# Patient Record
Sex: Female | Born: 1980 | Race: Asian | Hispanic: No | Smoking: Never smoker
Health system: Southern US, Community
[De-identification: ages and names within clinical notes are randomized; demographics above are authoritative.]

## PROBLEM LIST (undated history)

## (undated) ENCOUNTER — Inpatient Hospital Stay (HOSPITAL_COMMUNITY): Payer: Self-pay

## (undated) DIAGNOSIS — Z789 Other specified health status: Secondary | ICD-10-CM

## (undated) HISTORY — PX: NO PAST SURGERIES: SHX2092

---

## 2003-07-05 ENCOUNTER — Inpatient Hospital Stay (HOSPITAL_COMMUNITY): Admission: AD | Admit: 2003-07-05 | Discharge: 2003-07-05 | Payer: Self-pay | Admitting: Family Medicine

## 2003-07-15 ENCOUNTER — Emergency Department (HOSPITAL_COMMUNITY): Admission: EM | Admit: 2003-07-15 | Discharge: 2003-07-15 | Payer: Self-pay | Admitting: Emergency Medicine

## 2003-07-23 ENCOUNTER — Inpatient Hospital Stay (HOSPITAL_COMMUNITY): Admission: AD | Admit: 2003-07-23 | Discharge: 2003-07-24 | Payer: Self-pay | Admitting: Family Medicine

## 2003-08-17 ENCOUNTER — Encounter: Admission: RE | Admit: 2003-08-17 | Discharge: 2003-08-17 | Payer: Self-pay | Admitting: *Deleted

## 2003-08-19 ENCOUNTER — Ambulatory Visit (HOSPITAL_COMMUNITY): Admission: RE | Admit: 2003-08-19 | Discharge: 2003-08-19 | Payer: Self-pay | Admitting: Obstetrics and Gynecology

## 2003-11-07 ENCOUNTER — Inpatient Hospital Stay (HOSPITAL_COMMUNITY): Admission: AD | Admit: 2003-11-07 | Discharge: 2003-11-09 | Payer: Self-pay | Admitting: Obstetrics

## 2006-05-07 ENCOUNTER — Inpatient Hospital Stay (HOSPITAL_COMMUNITY): Admission: AD | Admit: 2006-05-07 | Discharge: 2006-05-07 | Payer: Self-pay | Admitting: Obstetrics and Gynecology

## 2007-03-27 ENCOUNTER — Emergency Department (HOSPITAL_COMMUNITY): Admission: EM | Admit: 2007-03-27 | Discharge: 2007-03-27 | Payer: Self-pay | Admitting: Emergency Medicine

## 2008-10-28 ENCOUNTER — Ambulatory Visit: Payer: Self-pay | Admitting: Obstetrics and Gynecology

## 2008-11-04 ENCOUNTER — Ambulatory Visit: Payer: Self-pay | Admitting: Family Medicine

## 2008-11-04 ENCOUNTER — Other Ambulatory Visit: Admission: RE | Admit: 2008-11-04 | Discharge: 2008-11-04 | Payer: Self-pay | Admitting: Obstetrics and Gynecology

## 2008-11-18 ENCOUNTER — Ambulatory Visit: Payer: Self-pay | Admitting: Obstetrics & Gynecology

## 2009-02-23 ENCOUNTER — Encounter: Payer: Self-pay | Admitting: Obstetrics and Gynecology

## 2009-02-23 ENCOUNTER — Ambulatory Visit: Payer: Self-pay | Admitting: Obstetrics and Gynecology

## 2009-03-31 ENCOUNTER — Ambulatory Visit: Payer: Self-pay | Admitting: Obstetrics and Gynecology

## 2009-03-31 ENCOUNTER — Other Ambulatory Visit: Admission: RE | Admit: 2009-03-31 | Discharge: 2009-03-31 | Payer: Self-pay | Admitting: Obstetrics and Gynecology

## 2009-09-08 ENCOUNTER — Emergency Department (HOSPITAL_COMMUNITY): Admission: EM | Admit: 2009-09-08 | Discharge: 2009-09-08 | Payer: Self-pay | Admitting: Emergency Medicine

## 2009-10-21 ENCOUNTER — Ambulatory Visit: Payer: Self-pay | Admitting: Obstetrics & Gynecology

## 2009-11-03 ENCOUNTER — Ambulatory Visit (HOSPITAL_COMMUNITY): Admission: RE | Admit: 2009-11-03 | Discharge: 2009-11-03 | Payer: Self-pay | Admitting: Family Medicine

## 2009-12-15 ENCOUNTER — Ambulatory Visit (HOSPITAL_COMMUNITY): Admission: RE | Admit: 2009-12-15 | Discharge: 2009-12-15 | Payer: Self-pay | Admitting: Obstetrics & Gynecology

## 2010-01-05 ENCOUNTER — Ambulatory Visit (HOSPITAL_COMMUNITY): Admission: RE | Admit: 2010-01-05 | Discharge: 2010-01-05 | Payer: Self-pay | Admitting: Obstetrics & Gynecology

## 2010-01-27 ENCOUNTER — Ambulatory Visit (HOSPITAL_COMMUNITY): Admission: RE | Admit: 2010-01-27 | Discharge: 2010-01-27 | Payer: Self-pay | Admitting: Obstetrics & Gynecology

## 2010-02-15 ENCOUNTER — Ambulatory Visit (HOSPITAL_COMMUNITY): Admission: RE | Admit: 2010-02-15 | Discharge: 2010-02-15 | Payer: Self-pay | Admitting: Obstetrics & Gynecology

## 2010-05-31 ENCOUNTER — Inpatient Hospital Stay (HOSPITAL_COMMUNITY): Admission: AD | Admit: 2010-05-31 | Discharge: 2010-06-03 | Payer: Self-pay | Admitting: Obstetrics

## 2010-09-03 ENCOUNTER — Emergency Department (HOSPITAL_COMMUNITY)
Admission: EM | Admit: 2010-09-03 | Discharge: 2010-09-03 | Payer: Self-pay | Source: Home / Self Care | Admitting: Emergency Medicine

## 2010-10-22 ENCOUNTER — Encounter: Payer: Self-pay | Admitting: Obstetrics & Gynecology

## 2010-12-14 LAB — CBC
HCT: 24.7 % — ABNORMAL LOW (ref 36.0–46.0)
HCT: 31.5 % — ABNORMAL LOW (ref 36.0–46.0)
Hemoglobin: 7.5 g/dL — ABNORMAL LOW (ref 12.0–15.0)
Hemoglobin: 9.8 g/dL — ABNORMAL LOW (ref 12.0–15.0)
MCH: 24.7 pg — ABNORMAL LOW (ref 26.0–34.0)
MCH: 24.9 pg — ABNORMAL LOW (ref 26.0–34.0)
MCHC: 30.5 g/dL (ref 30.0–36.0)
MCHC: 31.1 g/dL (ref 30.0–36.0)
MCV: 80.1 fL (ref 78.0–100.0)
MCV: 81.2 fL (ref 78.0–100.0)
Platelets: 212 10*3/uL (ref 150–400)
Platelets: 276 10*3/uL (ref 150–400)
RBC: 3.04 MIL/uL — ABNORMAL LOW (ref 3.87–5.11)
RBC: 3.93 MIL/uL (ref 3.87–5.11)
RDW: 15.9 % — ABNORMAL HIGH (ref 11.5–15.5)
RDW: 16 % — ABNORMAL HIGH (ref 11.5–15.5)
WBC: 10.3 10*3/uL (ref 4.0–10.5)
WBC: 13.9 10*3/uL — ABNORMAL HIGH (ref 4.0–10.5)

## 2010-12-14 LAB — RPR: RPR Ser Ql: NONREACTIVE

## 2010-12-17 LAB — POCT PREGNANCY, URINE: Preg Test, Ur: POSITIVE

## 2011-01-07 LAB — POCT PREGNANCY, URINE: Preg Test, Ur: NEGATIVE

## 2011-01-09 LAB — POCT PREGNANCY, URINE: Preg Test, Ur: NEGATIVE

## 2011-01-16 LAB — POCT PREGNANCY, URINE: Preg Test, Ur: NEGATIVE

## 2011-02-13 NOTE — Group Therapy Note (Signed)
NAME:  HILLERY, Erika Little NO.:  [id number]   MEDICAL RECORD NO.:  [id number]          PATIENT TYPE:  WOC   LOCATION:  [facility]                   FACILITY:  [facility]   PHYSICIAN:  Argentina Donovan, MD        DATE OF BIRTH:  16-Nov-1980   DATE OF SERVICE:  10/28/2008                                  CLINIC NOTE   The patient is a 30 year old Asian female gravida 2, para 1-0-1-14 with a  77-year-old child who had a slightly abnormal Pap smear a year and a half  ago.  It was repeated 6 months later, then following 6 months later.  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  She came in today for evaluation for treatment and because of  the probable involvement of the endocervix, I thought that the LEEP  procedure would be the best treatment for her.  In looking at the cervix  it is well epithelialized and I think she is a good risk for an office  LEEP procedure.  I have described the procedure in detail to the patient  with the risks and possible bleeding and difficulty perhaps carrying a  term pregnancy because of weakness in the cervix, an d we talked about  these things.  She seems to understand.  At the and I asked her if she  had any questions.  She had none.  I am having her watch the film on a  LEEP and then we will let her return for the procedure to be done.  I  will ask her one more time before she leaves if she has any further  questions.           ______________________________  Argentina Donovan, MD     PR/MEDQ  D:  10/28/2008  T:  10/28/2008  Job:  [id number]

## 2011-02-13 NOTE — Group Therapy Note (Signed)
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  The patient was in today scheduled for a colposcopy follow-  up, which I feel is somewhat over treatment, so we did a Pap smear  today.  Based on that, if that is abnormal, we will have her come back  for colposcopy.  Otherwise, a repeat Pap smear in 6 months.   IMPRESSION:  Severe cervical dysplasia post LEEP.  Pap smear done.           ______________________________  Argentina Donovan, MD     PR/MEDQ  D:  02/23/2009  T:  02/23/2009  Job:  [id number]

## 2011-02-13 NOTE — Group Therapy Note (Signed)
Erika, Little NO.:  [id number]   MEDICAL RECORD NO.:  [id number]          PATIENT TYPE:  WOC   LOCATION:  [facility]                   FACILITY:  [facility]   PHYSICIAN:  Scheryl Darter, MD       DATE OF BIRTH:  March 05, 1981   DATE OF SERVICE:  11/04/2008                                  CLINIC NOTE   REASON FOR VISIT:  LEEP.   INDICATIONS FOR PROCEDURE:  Ms. Erika Little is a 30 year old gravida 2,  para 1-0-1-1 who had a recent colposcopy revealing focal HGSIL lesion  with dysplasia extending to his endocervical glands.  An endocervical  curettage showed ascus with at least low-grade SIL.  She has previously  been counseled on the risks and benefits of LEEP and has previously  watched video.  Today, I reviewed the risks and benefits of the  procedure to include but not limiting to bleeding, infection, pain and  need for further procedure as well as the possibility of cervical  components, and subsequent pregnancies.  The patient voiced  understanding the risks and was in agreement to proceed with the  procedure.   DESCRIPTION OF PROCEDURE:  An informed consent was obtained and was on  the chart of the patient.  A time-out was conducted.  Speculum was  inserted and under colposcopic evaluation acetowhite epithelium was  placed on the cervix.  The acetowhite area was identified at the 11  o'clock position.  A paracervical block was done using 1% lidocaine with  epinephrine at 10 mL.  A LEEP biopsy was obtained using the Fischer LEEP  biopsy cone without difficulties.  The edges of the biopsy were then  treated with electrocautery and then Monsel solution was placed on the  biopsy area.  Good hemostasis noted.  A speculum was removed and the  patient was given post LEEP instructions.   ESTIMATED BLOOD LOSS:  Minimal.   COMPLICATIONS:  None immediate.   The patient was discharged in good condition with standard instructions  as well as an appointment to follow up  in 2 weeks for the results of the  procedure.     ______________________________  Odie Sera, D.O.    ______________________________  Scheryl Darter, MD    MC/MEDQ  D:  11/04/2008  T:  11/05/2008  Job:  [id number]

## 2011-12-01 IMAGING — US US OB COMP LESS 14 WK
1 series · 14 of 15 positions shown · non-contrast
Comparison: none

OBSTETRICAL ULTRASOUND:
 This ultrasound exam was performed in the [HOSPITAL] Ultrasound Department.  The OB US report was generated in the AS system, and faxed to the ordering physician.  This report is also available in [HOSPITAL]?s Access[employer] and in [REDACTED] PACS.

[Series 1: us ob comp less 14 wks · 14 of 15 slices shown]
[im 1/15]
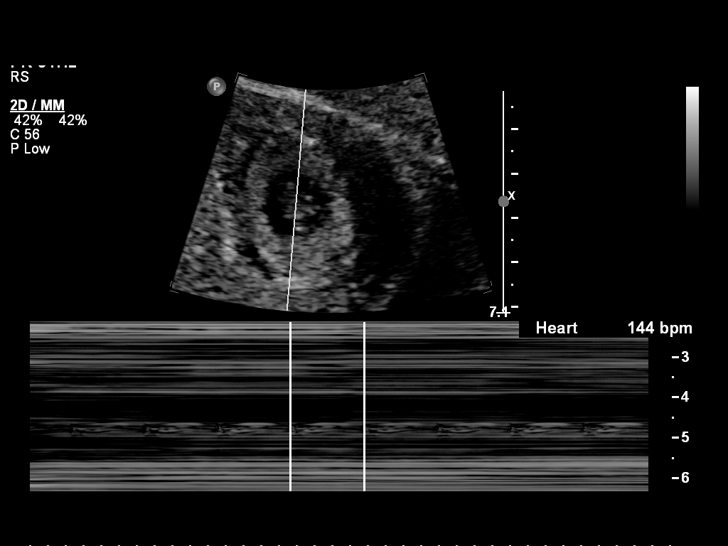
[im 2/15]
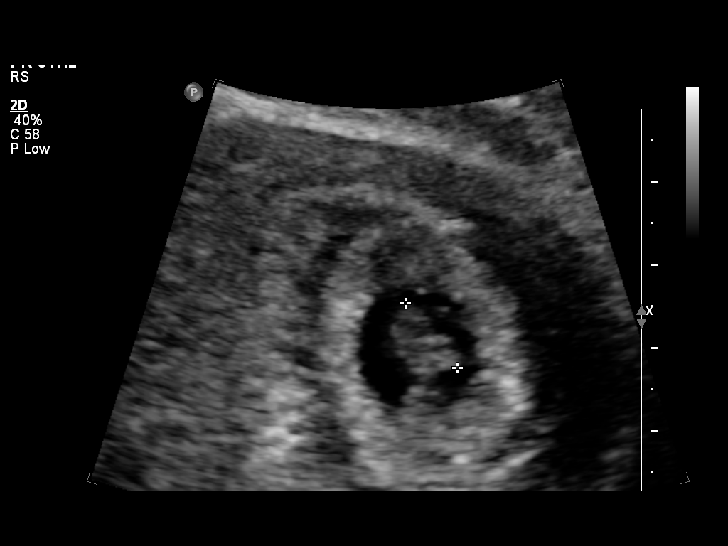
[im 3/15]
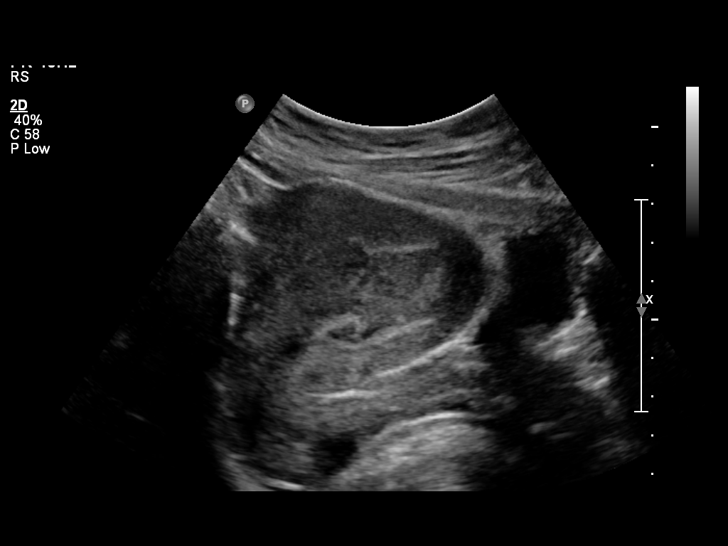
[im 4/15]
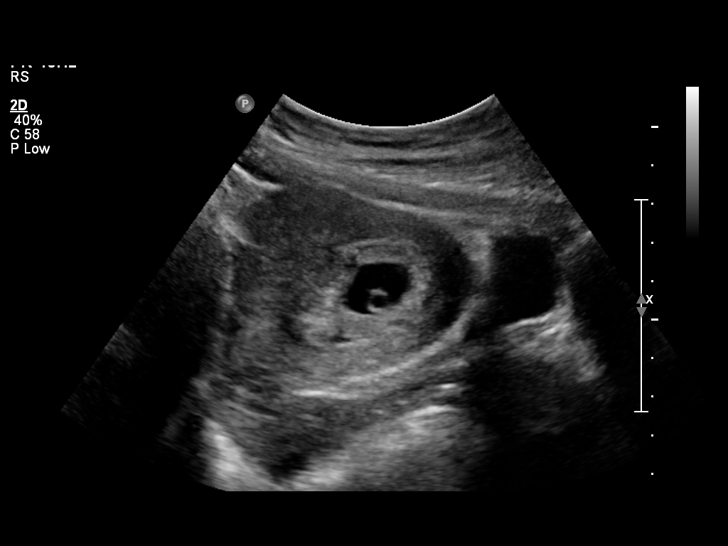
[im 5/15]
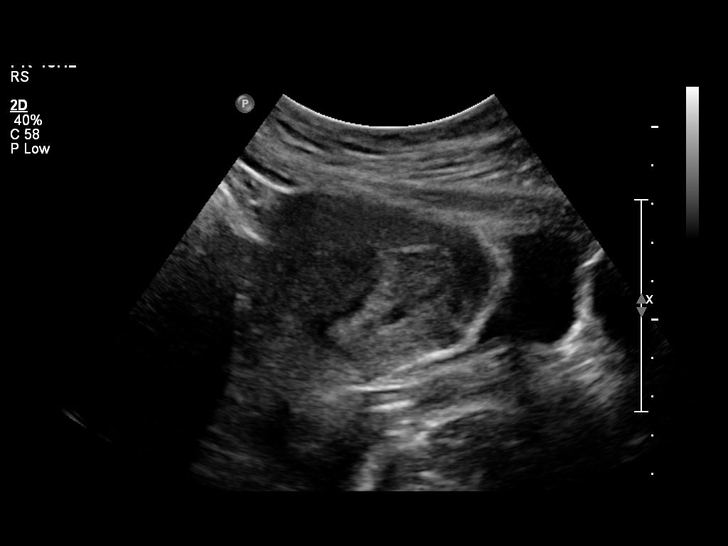
[im 6/15]
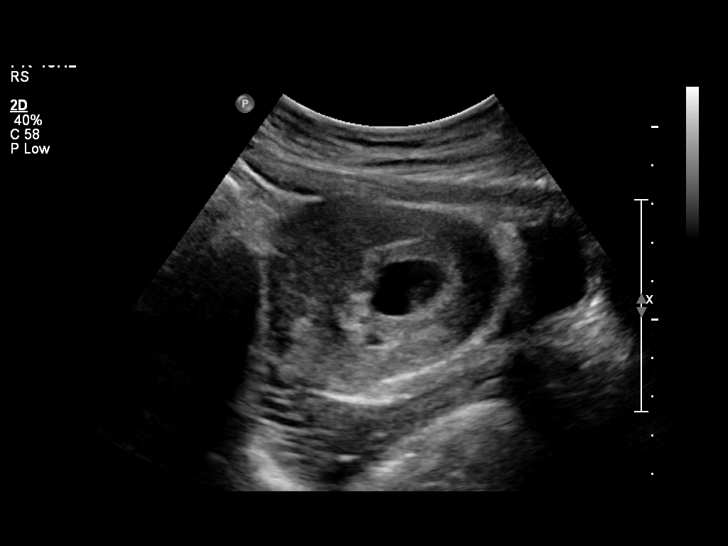
[im 7/15]
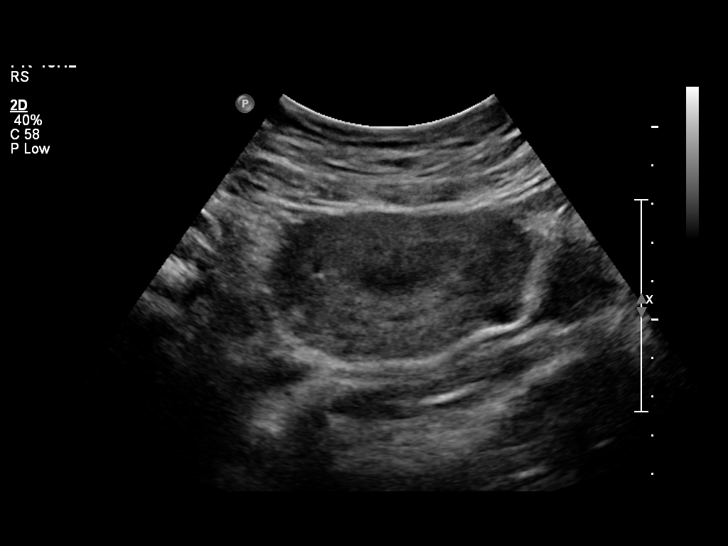
[im 9/15]
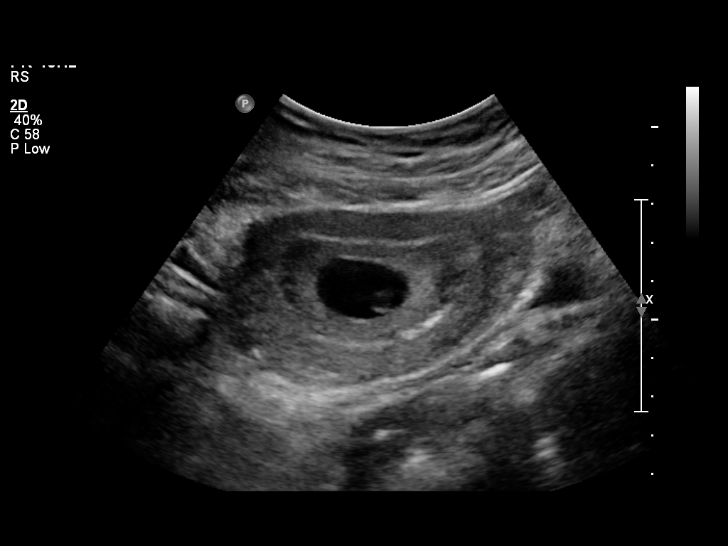
[im 10/15]
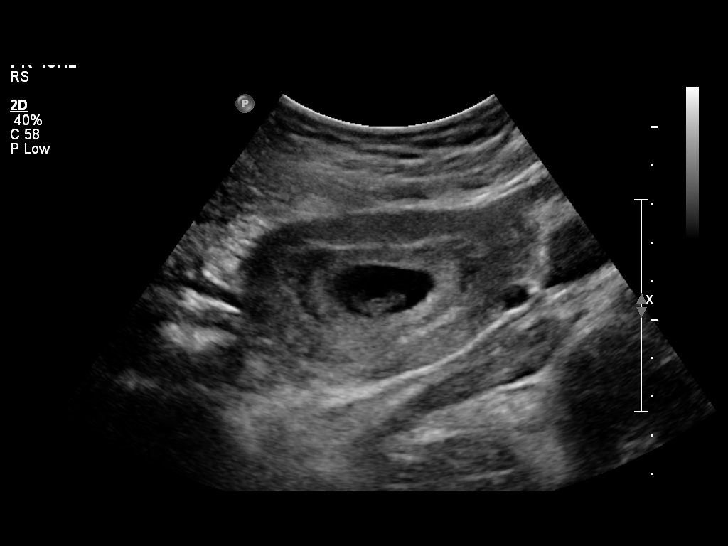
[im 11/15]
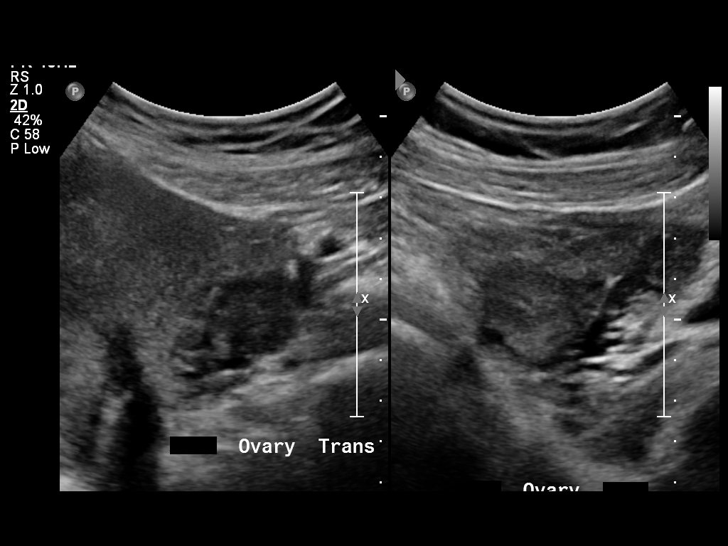
[im 12/15]
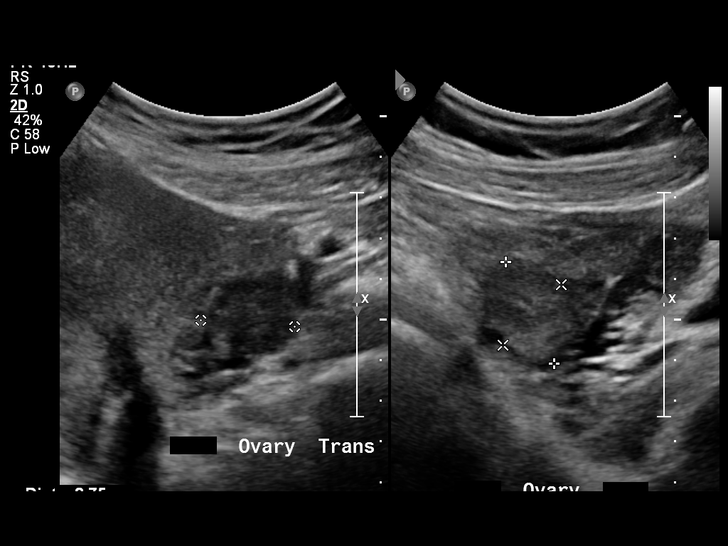
[im 13/15]
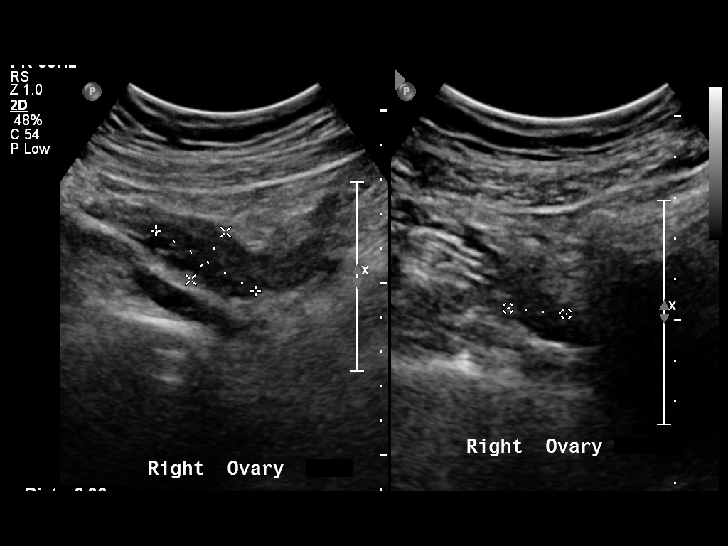
[im 14/15]
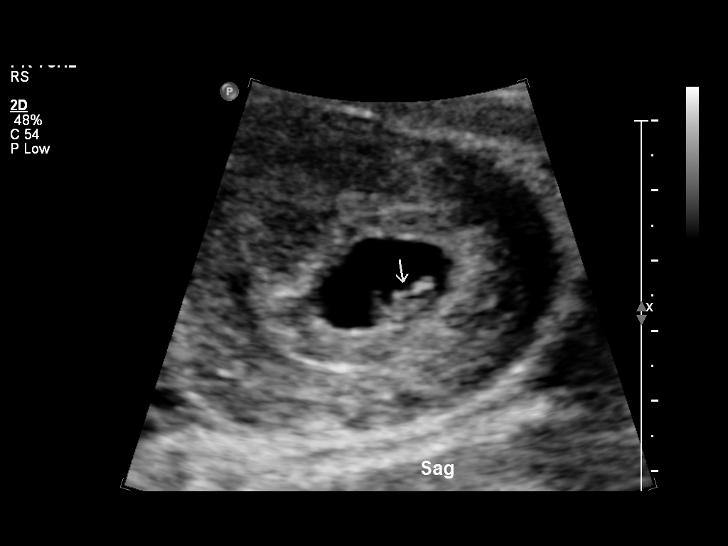
[im 15/15]
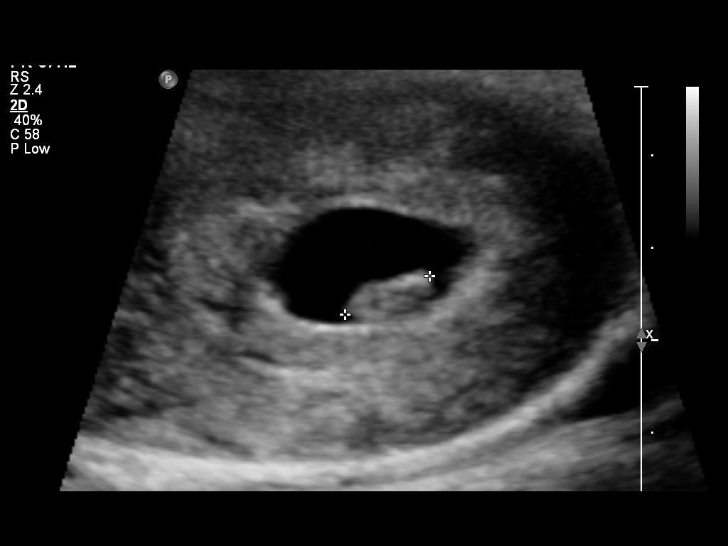

[14 of 15 positions shown; findings below may reference images not displayed]

IMPRESSION: See [facility] Obstetric US report.

## 2012-02-29 ENCOUNTER — Emergency Department (HOSPITAL_COMMUNITY)
Admission: EM | Admit: 2012-02-29 | Discharge: 2012-02-29 | Disposition: A | Discharge status: Home / Self Care | Payer: Self-pay | Attending: Emergency Medicine | Admitting: Emergency Medicine

## 2012-02-29 ENCOUNTER — Emergency Department (HOSPITAL_COMMUNITY): Payer: Self-pay

## 2012-02-29 ENCOUNTER — Encounter (HOSPITAL_COMMUNITY): Payer: Self-pay

## 2012-02-29 DIAGNOSIS — H538 Other visual disturbances: Secondary | ICD-10-CM | POA: Insufficient documentation

## 2012-02-29 DIAGNOSIS — G44209 Tension-type headache, unspecified, not intractable: Secondary | ICD-10-CM | POA: Insufficient documentation

## 2012-02-29 LAB — URINALYSIS, ROUTINE W REFLEX MICROSCOPIC
Glucose, UA: NEGATIVE mg/dL
Hgb urine dipstick: NEGATIVE
Ketones, ur: NEGATIVE mg/dL
Nitrite: NEGATIVE
Protein, ur: NEGATIVE mg/dL
Specific Gravity, Urine: 1.026 (ref 1.005–1.030)
Urobilinogen, UA: 1 mg/dL (ref 0.0–1.0)
pH: 6.5 (ref 5.0–8.0)

## 2012-02-29 LAB — URINE MICROSCOPIC-ADD ON

## 2012-02-29 MED ORDER — METOCLOPRAMIDE HCL 10 MG PO TABS: 10.0000 mg | ORAL_TABLET | Freq: Four times a day (QID) | ORAL | Status: DC | PRN

## 2012-02-29 MED ORDER — DIPHENHYDRAMINE HCL 50 MG/ML IJ SOLN
25.0000 mg | Freq: Once | INTRAMUSCULAR | Status: AC
  Administered 2012-02-29: 25 mg via INTRAVENOUS
  Filled 2012-02-29: qty 1

## 2012-02-29 MED ORDER — METOCLOPRAMIDE HCL 5 MG/ML IJ SOLN
10.0000 mg | Freq: Once | INTRAMUSCULAR | Status: AC
  Administered 2012-02-29: 10 mg via INTRAVENOUS
  Filled 2012-02-29: qty 2

## 2012-02-29 MED ORDER — SODIUM CHLORIDE 0.9 % IV BOLUS (SEPSIS)
1000.0000 mL | Freq: Once | INTRAVENOUS | Status: AC
  Administered 2012-02-29: 1000 mL via INTRAVENOUS

## 2012-02-29 NOTE — Discharge Instructions (Signed)
Your headache has some components of tension headache and some components of migraine headache, so I'm giving you information about both. Tried taking the metoclopramide tablets as soon as possible at the start of headache.  Tension Headache (Muscle Contraction Headache) Tension headache is one of the most common causes of head pain. These headaches are usually felt as a pain over the top of your head and back of your neck. Stress, anxiety, and depression are common triggers for these headaches. Tension headaches are not life-threatening and will not lead to other types of headaches. Tension headaches can often be diagnosed by taking a history from the patient and a physical exam. Sometimes, further lab and x-ray studies are used to confirm the diagnosis. Your caregiver can advise you on how to get help solving problems that cause anxiety or stress. Antidepressants can be prescribed if depression is a problem. HOME CARE INSTRUCTIONS   If testing was done, call for your results. Remember, it is your responsibility to get the results of all testing. Do not assume everything is fine because you do not hear from your caregiver.   Only take over-the-counter or prescription medicines for pain, discomfort, or fever as directed by your caregiver.   Biofeedback, massage, or other relaxation techniques may be helpful.   Ice packs or heat to the head and neck can be used. Use these three to four times per day or as needed.   Physical therapy may be a useful addition to treatment.   If headaches continue, even with therapy, you may need to think about lifestyle changes.   Avoid excessive use of pain killers, as rebound headaches can occur.  SEEK MEDICAL CARE IF:   You develop problems with medications prescribed.   You do not respond or get no relief from medications.   You have a change from the usual headache.   You develop nausea (feeling sick to your stomach) or vomiting.  SEEK IMMEDIATE MEDICAL  CARE IF:   Your headache becomes severe.   You have an unexplained oral temperature above 102 F (38.9 C).   You develop a stiff neck.   You have loss of vision.   You have muscular weakness.   You have loss of muscular control.   You develop severe symptoms different from your first symptoms.   You start losing your balance or have trouble walking.   You feel faint or pass out.  MAKE SURE YOU:   Understand these instructions.   Will watch your condition.   Will get help right away if you are not doing well or get worse.  Document Released: 09/17/2005 Document Revised: 09/06/2011 Document Reviewed: 05/06/2008 [facility] Patient Information 2012 [place], [place].  Migraine Headache A migraine headache is an intense, throbbing pain on one or both sides of your head. The exact cause of a migraine headache is not always known. A migraine may be caused when nerves in the brain become irritated and release chemicals that cause swelling within blood vessels, causing pain. Many migraine sufferers have a family history of migraines. Before you get a migraine you may or may not get an aura. An aura is a group of symptoms that can predict the beginning of a migraine. An aura may include:  Visual changes such as:   Flashing lights.   Bright spots or zig-zag lines.   Tunnel vision.   Feelings of numbness.   Trouble talking.   Muscle weakness.  SYMPTOMS  Pain on one or both sides of your  head.   Pain that is pulsating or throbbing in nature.   Pain that is severe enough to prevent daily activities.   Pain that is aggravated by any daily physical activity.   Nausea (feeling sick to your stomach), vomiting, or both.   Pain with exposure to bright lights, loud noises, or activity.   General sensitivity to bright lights or loud noises.  MIGRAINE TRIGGERS Examples of triggers of migraine headaches include:   Alcohol.   Smoking.   Stress.   It may be related to menses  (female menstruation).   Aged cheeses.   Foods or drinks that contain nitrates, glutamate, aspartame, or tyramine.   Lack of sleep.   Chocolate.   Caffeine.   Hunger.   Medications such as nitroglycerine (used to treat chest pain), birth control pills, estrogen, and some blood pressure medications.  DIAGNOSIS  A migraine headache is often diagnosed based on:  Symptoms.   Physical examination.   A computerized X-ray scan (computed tomography, CT) of your head.  TREATMENT  Medications can help prevent migraines if they are recurrent or should they become recurrent. Your caregiver can help you with a medication or treatment program that will be helpful to you.   Lying down in a dark, quiet room may be helpful.   Keeping a headache diary may help you find a trend as to what may be triggering your headaches.  SEEK IMMEDIATE MEDICAL CARE IF:   You have confusion, personality changes or seizures.   You have headaches that wake you from sleep.   You have an increased frequency in your headaches.   You have a stiff neck.   You have a loss of vision.   You have muscle weakness.   You start losing your balance or have trouble walking.   You feel faint or pass out.  MAKE SURE YOU:   Understand these instructions.   Will watch your condition.   Will get help right away if you are not doing well or get worse.  Document Released: 09/17/2005 Document Revised: 09/06/2011 Document Reviewed: 05/03/2009 [facility] Patient Information 2012 [place], [place].  Metoclopramide tablets What is this medicine? METOCLOPRAMIDE (met oh kloe PRA mide) is used to treat the symptoms of gastroesophageal reflux disease (GERD) like heartburn. It is also used to treat people with slow emptying of the stomach and intestinal tract. This medicine may be used for other purposes; ask your health care provider or pharmacist if you have questions. What should I tell my health care provider before I take  this medicine? They need to know if you have any of these conditions: -breast cancer -depression -diabetes -heart failure -high blood pressure -kidney disease -liver disease -Parkinson's disease or a movement disorder -pheochromocytoma -seizures -stomach obstruction, bleeding, or perforation -an unusual or allergic reaction to metoclopramide, procainamide, sulfites, other medicines, foods, dyes, or preservatives -pregnant or trying to get pregnant -breast-feeding How should I use this medicine? Take this medicine by mouth with a glass of water. Follow the directions on the prescription label. Take this medicine on an empty stomach, about 30 minutes before eating. Take your doses at regular intervals. Do not take your medicine more often than directed. Do not stop taking except on the advice of your doctor or health care professional. A special MedGuide will be given to you by the pharmacist with each prescription and refill. Be sure to read this information carefully each time. Talk to your pediatrician regarding the use of this medicine in children. Special  care may be needed. Overdosage: If you think you have taken too much of this medicine contact a poison control center or emergency room at once. NOTE: This medicine is only for you. Do not share this medicine with others. What if I miss a dose? If you miss a dose, take it as soon as you can. If it is almost time for your next dose, take only that dose. Do not take double or extra doses. What may interact with this medicine? -acetaminophen -cyclosporine -digoxin -medicines for blood pressure -medicines for diabetes, including insulin -medicines for hay fever and other allergies -medicines for depression, especially an Monoamine Oxidase Inhibitor (MAOI) -medicines for Parkinson's disease, like levodopa -medicines for sleep or for pain -tetracycline This list may not describe all possible interactions. Give your health care  provider a list of all the medicines, herbs, non-prescription drugs, or dietary supplements you use. Also tell them if you smoke, drink alcohol, or use illegal drugs. Some items may interact with your medicine. What should I watch for while using this medicine? It may take a few weeks for your stomach condition to start to get better. However, do not take this medicine for longer than 12 weeks. The longer you take this medicine, and the more you take it, the greater your chances are of developing serious side effects. If you are an elderly patient, a female patient, or you have diabetes, you may be at an increased risk for side effects from this medicine. Contact your doctor immediately if you start having movements you cannot control such as lip smacking, rapid movements of the tongue, involuntary or uncontrollable movements of the eyes, head, arms and legs, or muscle twitches and spasms. Patients and their families should watch out for worsening depression or thoughts of suicide. Also watch out for any sudden or severe changes in feelings such as feeling anxious, agitated, panicky, irritable, hostile, aggressive, impulsive, severely restless, overly excited and hyperactive, or not being able to sleep. If this happens, especially at the beginning of treatment or after a change in dose, call your doctor. Do not treat yourself for high fever. Ask your doctor or health care professional for advice. You may get drowsy or dizzy. Do not drive, use machinery, or do anything that needs mental alertness until you know how this drug affects you. Do not stand or sit up quickly, especially if you are an older patient. This reduces the risk of dizzy or fainting spells. Alcohol can make you more drowsy and dizzy. Avoid alcoholic drinks. What side effects may I notice from receiving this medicine? Side effects that you should report to your doctor or health care professional as soon as possible: -allergic reactions like  skin rash, itching or hives, swelling of the face, lips, or tongue -abnormal production of milk in females -breast enlargement in both males and females -change in the way you walk -difficulty moving, speaking or swallowing -drooling, lip smacking, or rapid movements of the tongue -excessive sweating -fever -involuntary or uncontrollable movements of the eyes, head, arms and legs -irregular heartbeat or palpitations -muscle twitches and spasms -unusually weak or tired Side effects that usually do not require medical attention (report to your doctor or health care professional if they continue or are bothersome): -change in sex drive or performance -depressed mood -diarrhea -difficulty sleeping -headache -menstrual changes -restless or nervous This list may not describe all possible side effects. Call your doctor for medical advice about side effects. You may report side effects to FDA  at 1-800-FDA-1088. Where should I keep my medicine? Keep out of the reach of children. Store at room temperature between 20 and 25 degrees C (68 and 77 degrees F). Protect from light. Keep container tightly closed. Throw away any unused medicine after the expiration date. NOTE: This sheet is a summary. It may not cover all possible information. If you have questions about this medicine, talk to your doctor, pharmacist, or health care provider.  2012, [employer]. (05/12/2008 4:30:05 PM)

## 2012-02-29 NOTE — ED Notes (Signed)
Pt presents with "all over" headache x 1.5 years (since childbirth).  Pt reports getting headaches every week.  +nausea and vomiting, intermittent blurred vision.

## 2012-02-29 NOTE — ED Provider Notes (Signed)
History     CSN: [id number]  Arrival date & time 02/29/12  1126   First MD Initiated Contact with Patient 02/29/12 1140      Chief Complaint  Patient presents with  . Headache    (Consider location/radiation/quality/duration/timing/severity/associated sxs/prior treatment) Patient is a 31 y.o. female presenting with headaches. The history is provided by the patient.  Headache   She has been having intermittent headaches for about the last 1.5 years. Headaches come about once a week and will last all day and part of another day. They're sometimes global but usually right sided and described as dull and throbbing. Pain is 10 over 10 at its worst. Current headache started yesterday and is improved today and is only 5/10. There is sometimes associated blurring of vision and nausea and vomiting. She denies fever, chills, sweats. Headache is worse with exposure to light. The sometimes improve temporarily by massaging although it will return shortly after the massage ascended. She's tried taking Advil with no relief. Of note, headache started about 6 months after the birth of her last baby. Headaches are coming slightly more frequently now but the intensity has not changed.  History reviewed. No pertinent past medical history.  History reviewed. No pertinent past surgical history.  No family history on file.  History  Substance Use Topics  . Smoking status: Never Smoker   . Smokeless tobacco: Not on file  . Alcohol Use: No    OB History    Grav Para Term Preterm Abortions TAB SAB Ect Mult Living                  Review of Systems  Neurological: Positive for headaches.  All other systems reviewed and are negative.    Allergies  Review of patient's allergies indicates no known allergies.  Home Medications   Current Outpatient Rx  Name Route Sig Dispense Refill  . IBUPROFEN 200 MG PO TABS Oral Take 400 mg by mouth every 6 (six) hours as needed. For pain      BP 128/91   Pulse 92  Temp(Src) 98.5 F (36.9 C) (Oral)  Resp 18  Ht 5' (1.524 m)  Wt 125 lb (56.7 kg)  BMI 24.41 kg/m2  SpO2 98%  LMP 01/25/2012  Physical Exam  Nursing note and vitals reviewed.  31 year old female who is resting comfortably in no acute distress. Vital signs are significant for borderline diastolic hypertension with blood pressure 128/91. Oxygen saturation is 98% which is normal. Head is normocephalic and atraumatic. PERRLA, EOMI. Fundi show no hemorrhage, exudate, or papilledema. There is no facial asymmetry. Oropharynx is clear. There is moderate tenderness palpation over the temporalis muscles bilaterally and also over the insertion of the paracervical muscles bilaterally. Neck is nontender except for the above-noted tenderness at insertion of the paracervical muscles. Neck is supple without adenopathy. Back is nontender. Lungs are clear without rales, wheezes, rhonchi. Heart has regular rate and rhythm without murmur. Abdomen is soft, flat, nontender without masses or hepatosplenomegaly. Extremities have full range of motion, no cyanosis or edema. Skin is warm and dry without rash. Neurologic: Mental status is normal, cranial nerves are intact, there are no focal motor or sensory deficits. Coordination is normal. Gait is normal.  ED Course  Procedures (including critical care time)  Results for orders placed during the hospital encounter of 02/29/12  URINALYSIS, ROUTINE W REFLEX MICROSCOPIC      Component Value Range   Color, Urine AMBER (*) YELLOW    APPearance CLOUDY (*)  CLEAR    Specific Gravity, Urine 1.026  1.005 - 1.030    pH 6.5  5.0 - 8.0    Glucose, UA NEGATIVE  NEGATIVE (mg/dL)   Hgb urine dipstick NEGATIVE  NEGATIVE    Bilirubin Urine SMALL (*) NEGATIVE    Ketones, ur NEGATIVE  NEGATIVE (mg/dL)   Protein, ur NEGATIVE  NEGATIVE (mg/dL)   Urobilinogen, UA 1.0  0.0 - 1.0 (mg/dL)   Nitrite NEGATIVE  NEGATIVE    Leukocytes, UA LARGE (*) NEGATIVE   POCT PREGNANCY, URINE       Component Value Range   Preg Test, Ur NEGATIVE  NEGATIVE   URINE MICROSCOPIC-ADD ON      Component Value Range   Squamous Epithelial / LPF MANY (*) RARE    WBC, UA 11-20  <3 (WBC/hpf)   RBC / HPF 0-2  <3 (RBC/hpf)   Bacteria, UA FEW (*) RARE    Ct Head Wo Contrast  02/29/2012  *RADIOLOGY REPORT*  Clinical Data:  Headache  CT HEAD WITHOUT CONTRAST  Technique:  Contiguous axial images were obtained from the base of the skull through the vertex without contrast  Comparison:  None.  Findings:  The brain has a normal appearance without evidence for hemorrhage, acute infarction, hydrocephalus, or mass lesion.  There is no extra axial fluid collection.  The skull and paranasal sinuses are normal.  IMPRESSION: Normal CT of the head without contrast.  Original Report Authenticated By: Camelia Phenes, M.D.      1. Muscle contraction headache       MDM  Headache which is probably a muscle contraction headache but possibly a migraine variant. With onset after pregnancy, consider possibility of Sheehan syndrome. CT will be obtained and she will be treated with a headache cocktail.   She got excellent relief of headache after IV fluid bolus and IV metoclopramide and IV diphenhydramine. Head CT is negative. She'll be sent home with a prescription for metoclopramide. Urinalysis has pyuria but it appears to be a contaminated specimen and therefore she will not be treated for urinary tract infection.     Dione Booze, MD 02/29/12 1341

## 2012-02-29 NOTE — ED Provider Notes (Deleted)
   Dione Booze, MD 02/29/12 1344

## 2014-10-01 NOTE — L&D Delivery Note (Signed)
Pt was admitted in labor. She had AROM with clear fluid. She completed the first stage with out difficulty. She pushed for 30 min and had a SVD on one live viable infant over an intact perineum. Placenta-S/I. EBL-400cc. Baby to NBN.

## 2014-12-17 ENCOUNTER — Inpatient Hospital Stay (HOSPITAL_COMMUNITY)
Admission: AD | Admit: 2014-12-17 | Discharge: 2014-12-17 | Disposition: A | Discharge status: Home / Self Care | Payer: Self-pay | Source: Ambulatory Visit | Attending: Obstetrics and Gynecology | Admitting: Obstetrics and Gynecology

## 2014-12-17 ENCOUNTER — Emergency Department (HOSPITAL_COMMUNITY)
Admission: EM | Admit: 2014-12-17 | Discharge: 2014-12-17 | Disposition: A | Discharge status: Home / Self Care | Payer: Self-pay

## 2014-12-17 ENCOUNTER — Encounter (HOSPITAL_COMMUNITY): Payer: Self-pay | Admitting: *Deleted

## 2014-12-17 ENCOUNTER — Inpatient Hospital Stay (HOSPITAL_COMMUNITY): Payer: Self-pay

## 2014-12-17 DIAGNOSIS — O208 Other hemorrhage in early pregnancy: Secondary | ICD-10-CM | POA: Insufficient documentation

## 2014-12-17 DIAGNOSIS — O418X10 Other specified disorders of amniotic fluid and membranes, first trimester, not applicable or unspecified: Secondary | ICD-10-CM

## 2014-12-17 DIAGNOSIS — O26899 Other specified pregnancy related conditions, unspecified trimester: Secondary | ICD-10-CM

## 2014-12-17 DIAGNOSIS — O468X1 Other antepartum hemorrhage, first trimester: Secondary | ICD-10-CM

## 2014-12-17 DIAGNOSIS — R109 Unspecified abdominal pain: Secondary | ICD-10-CM | POA: Insufficient documentation

## 2014-12-17 DIAGNOSIS — Z3A08 8 weeks gestation of pregnancy: Secondary | ICD-10-CM | POA: Insufficient documentation

## 2014-12-17 HISTORY — DX: Other specified health status: Z78.9

## 2014-12-17 LAB — CBC
HCT: 29.6 % — ABNORMAL LOW (ref 36.0–46.0)
HEMOGLOBIN: 9.1 g/dL — AB (ref 12.0–15.0)
MCH: 23 pg — ABNORMAL LOW (ref 26.0–34.0)
MCHC: 30.7 g/dL (ref 30.0–36.0)
MCV: 74.9 fL — AB (ref 78.0–100.0)
Platelets: 237 10*3/uL (ref 150–400)
RBC: 3.95 MIL/uL (ref 3.87–5.11)
RDW: 16.4 % — ABNORMAL HIGH (ref 11.5–15.5)
WBC: 8.3 10*3/uL (ref 4.0–10.5)

## 2014-12-17 LAB — WET PREP, GENITAL
Clue Cells Wet Prep HPF POC: NONE SEEN
Trich, Wet Prep: NONE SEEN
Yeast Wet Prep HPF POC: NONE SEEN

## 2014-12-17 LAB — URINALYSIS, ROUTINE W REFLEX MICROSCOPIC
Bilirubin Urine: NEGATIVE
Glucose, UA: NEGATIVE mg/dL
KETONES UR: NEGATIVE mg/dL
LEUKOCYTES UA: NEGATIVE
NITRITE: NEGATIVE
PH: 6 (ref 5.0–8.0)
PROTEIN: NEGATIVE mg/dL
SPECIFIC GRAVITY, URINE: 1.025 (ref 1.005–1.030)
UROBILINOGEN UA: 0.2 mg/dL (ref 0.0–1.0)

## 2014-12-17 LAB — HCG, QUANTITATIVE, PREGNANCY: hCG, Beta Chain, Quant, S: 47675 m[IU]/mL — ABNORMAL HIGH (ref <5)

## 2014-12-17 LAB — POCT PREGNANCY, URINE: Preg Test, Ur: POSITIVE — AB

## 2014-12-17 LAB — ABO/RH: ABO/RH(D): B POS

## 2014-12-17 LAB — URINE MICROSCOPIC-ADD ON

## 2014-12-17 NOTE — MAU Provider Note (Signed)
Chief Complaint: Vaginal Bleeding and Possible Pregnancy   First Provider Initiated Contact with Patient 12/17/14 1555      SUBJECTIVE HPI: Erika Little is a 34 y.o. G3P2004 at [redacted]w[redacted]d by LMP who presents to maternity admissions reporting light red/pink bleeding, enough to require a small pad, with onset last night and continuing today. The bleeding is accompanied by mild cramping.  She reports some spotting after intercourse 2 weeks ago which resolved and denies intercourse but reports some sexual activity with her partner last night before this bleeding.  She denies vaginal itching/burning, urinary symptoms, h/a, dizziness, n/v, or fever/chills.    Past Medical History  Diagnosis Date  . Medical history non-contributory    Past Surgical History  Procedure Laterality Date  . No past surgeries     History   Social History  . Marital Status: Legally Separated    Spouse Name: N/A  . Number of Children: N/A  . Years of Education: N/A   Occupational History  . Not on file.   Social History Main Topics  . Smoking status: Never Smoker   . Smokeless tobacco: Not on file  . Alcohol Use: No  . Drug Use: No  . Sexual Activity: Yes   Other Topics Concern  . Not on file   Social History Narrative   No current facility-administered medications on file prior to encounter.   No current outpatient prescriptions on file prior to encounter.   No Known Allergies  ROS: Pertinent items in HPI  OBJECTIVE Blood pressure 118/72, pulse 88, temperature 97.7 F (36.5 C), temperature source Oral, resp. rate 18, height  (1.473 m), weight 55.792 kg (123 lb), last menstrual period 10/20/2014. GENERAL: Well-developed, well-nourished female in no acute distress.  HEENT: Normocephalic HEART: normal rate RESP: normal effort ABDOMEN: Soft, non-tender EXTREMITIES: Nontender, no edema NEURO: Alert and oriented Pelvic exam: Cervix pink, visually closed, without lesion, small amount thin watery  bleeding, vaginal walls and external genitalia normal Bimanual exam: Cervix 0/long/high, firm, anterior, neg CMT, uterus nontender, ~ 8 week size, adnexa without tenderness, enlargement, or mass  LAB RESULTS Results for orders placed or performed during the hospital encounter of 12/17/14 (from the past 24 hour(s))  Pregnancy, urine POC     Status: Abnormal   Collection Time: 12/17/14  3:13 PM  Result Value Ref Range   Preg Test, Ur POSITIVE (A) NEGATIVE  Urinalysis, Routine w reflex microscopic     Status: Abnormal   Collection Time: 12/17/14  3:15 PM  Result Value Ref Range   Color, Urine YELLOW YELLOW   APPearance CLEAR CLEAR   Specific Gravity, Urine 1.025 1.005 - 1.030   pH 6.0 5.0 - 8.0   Glucose, UA NEGATIVE NEGATIVE mg/dL   Hgb urine dipstick LARGE (A) NEGATIVE   Bilirubin Urine NEGATIVE NEGATIVE   Ketones, ur NEGATIVE NEGATIVE mg/dL   Protein, ur NEGATIVE NEGATIVE mg/dL   Urobilinogen, UA 0.2 0.0 - 1.0 mg/dL   Nitrite NEGATIVE NEGATIVE   Leukocytes, UA NEGATIVE NEGATIVE  Wet prep, genital     Status: Abnormal   Collection Time: 12/17/14  3:15 PM  Result Value Ref Range   Yeast Wet Prep HPF POC NONE SEEN NONE SEEN   Trich, Wet Prep NONE SEEN NONE SEEN   Clue Cells Wet Prep HPF POC NONE SEEN NONE SEEN   WBC, Wet Prep HPF POC FEW (A) NONE SEEN  Urine microscopic-add on     Status: None   Collection Time: 12/17/14  3:15 PM  Result Value Ref Range   Squamous Epithelial / LPF RARE RARE   Bacteria, UA RARE RARE   Urine-Other MUCOUS PRESENT   CBC     Status: Abnormal   Collection Time: 12/17/14  3:29 PM  Result Value Ref Range   WBC 8.3 4.0 - 10.5 K/uL   RBC 3.95 3.87 - 5.11 MIL/uL   Hemoglobin 9.1 (L) 12.0 - 15.0 g/dL   HCT 11.9 (L) 14.7 - 82.9 %   MCV 74.9 (L) 78.0 - 100.0 fL   MCH 23.0 (L) 26.0 - 34.0 pg   MCHC 30.7 30.0 - 36.0 g/dL   RDW 56.2 (H) 13.0 - 86.5 %   Platelets 237 150 - 400 K/uL  hCG, quantitative, pregnancy     Status: Abnormal   Collection Time:  12/17/14  3:29 PM  Result Value Ref Range   hCG, Beta Chain, Quant, S 47675 (H) <5 mIU/mL  ABO/Rh     Status: None (Preliminary result)   Collection Time: 12/17/14  3:29 PM  Result Value Ref Range   ABO/RH(D) B POS     IMAGING US Ob Comp Less 14 Wks  12/17/2014   CLINICAL DATA:  Vaginal bleeding, estimated gestational age by last menstrual period equals 8 weeks 2 days.  EXAM: OBSTETRIC <14 WK [place] AND TRANSVAGINAL OB US  TECHNIQUE: Both transabdominal and transvaginal ultrasound examinations were performed for complete evaluation of the gestation as well as the maternal uterus, adnexal regions, and pelvic cul-de-sac. Transvaginal technique was performed to assess early pregnancy.  COMPARISON:  None.  FINDINGS: Intrauterine gestational sac: Single  Yolk sac:  Present  Embryo:  Present  Cardiac Activity: Present  Heart Rate: 137  bpm  CRL:  9.3  mm   7 w   0 d                  [place] EDC: 08/05/2015  Maternal uterus/adnexae: Moderate subchorionic hemorrhage noted. Corpus luteal cyst of the right ovary. No free fluid.  IMPRESSION: 1. Single intrauterine gestation with embryo and normal cardiac activity.  2. Estimated gestational age by crown rump length equals 7 weeks 0 days.  3. Moderate subchorionic hemorrhage   Electronically Signed   By: Genevive Bi M.D.   On: 12/17/2014 17:20   US Ob Transvaginal  12/17/2014   CLINICAL DATA:  Vaginal bleeding, estimated gestational age by last menstrual period equals 8 weeks 2 days.  EXAM: OBSTETRIC <14 WK [place] AND TRANSVAGINAL OB US  TECHNIQUE: Both transabdominal and transvaginal ultrasound examinations were performed for complete evaluation of the gestation as well as the maternal uterus, adnexal regions, and pelvic cul-de-sac. Transvaginal technique was performed to assess early pregnancy.  COMPARISON:  None.  FINDINGS: Intrauterine gestational sac: Single  Yolk sac:  Present  Embryo:  Present  Cardiac Activity: Present  Heart Rate: 137  bpm  CRL:  9.3  mm   7 w   0  d                  [place] EDC: 08/05/2015  Maternal uterus/adnexae: Moderate subchorionic hemorrhage noted. Corpus luteal cyst of the right ovary. No free fluid.  IMPRESSION: 1. Single intrauterine gestation with embryo and normal cardiac activity.  2. Estimated gestational age by crown rump length equals 7 weeks 0 days.  3. Moderate subchorionic hemorrhage   Electronically Signed   By: Genevive Bi M.D.   On: 12/17/2014 17:20    ASSESSMENT 1. Subchorionic hemorrhage in first trimester  2. Abdominal pain affecting pregnancy     PLAN Discharge home with bleeding precautions [facility], questions answered Pelvic rest F/U with early prenatal care, pt planning to see Dr Gaynell FaceMarshall Return to MAU as needed for emergencies    Medication List    STOP taking these medications        aspirin-acetaminophen-caffeine 250-250-65 MG per tablet  Commonly known as:  EXCEDRIN MIGRAINE     ibuprofen 200 MG tablet  Commonly known as:  ADVIL,MOTRIN     metoCLOPramide 10 MG tablet  Commonly known as:  REGLAN      TAKE these medications        prenatal multivitamin Tabs tablet  Take 1 tablet by mouth daily at 12 noon.     VITAMIN D PO  Take 1 tablet by mouth 2 (two) times a week.       Follow-up Information    Please follow up.   Why:  With prenatal provider of your choice      Sharen CounterLisa Leftwich-Kirby Certified Nurse-Midwife 12/17/2014  7:49 PM

## 2014-12-17 NOTE — Discharge Instructions (Signed)
Subchorionic Hematoma °A subchorionic hematoma is a gathering of blood between the outer wall of the placenta and the inner wall of the womb (uterus). The placenta is the organ that connects the fetus to the wall of the uterus. The placenta performs the feeding, breathing (oxygen to the fetus), and waste removal (excretory work) of the fetus.  °Subchorionic hematoma is the most common abnormality found on a result from ultrasonography done during the first trimester or early second trimester of pregnancy. If there has been little or no vaginal bleeding, early small hematomas usually shrink on their own and do not affect your baby or pregnancy. The blood is gradually absorbed over 1-2 weeks. When bleeding starts later in pregnancy or the hematoma is larger or occurs in an older pregnant woman, the outcome may not be as good. Larger hematomas may get bigger, which increases the chances for miscarriage. Subchorionic hematoma also increases the risk of premature detachment of the placenta from the uterus, preterm (premature) labor, and stillbirth. °HOME CARE INSTRUCTIONS °· Stay on bed rest if your health care provider recommends this. Although bed rest will not prevent more bleeding or prevent a miscarriage, your health care provider may recommend bed rest until you are advised otherwise. °· Avoid heavy lifting (more than 10 lb [4.5 kg]), exercise, sexual intercourse, or douching as directed by your health care provider. °· Keep track of the number of pads you use each day and how soaked (saturated) they are. Write down this information. °· Do not use tampons. °· Keep all follow-up appointments as directed by your health care provider. Your health care provider may ask you to have follow-up blood tests or ultrasound tests or both. °SEEK IMMEDIATE MEDICAL CARE IF: °· You have severe cramps in your stomach, back, abdomen, or pelvis. °· You have a fever. °· You pass large clots or tissue. Save any tissue for your health  care provider to look at. °· Your bleeding increases or you become lightheaded, feel weak, or have fainting episodes. °Document Released: 01/02/2007 Document Revised: 02/01/2014 Document Reviewed: 04/16/2013 °ExitCare® Patient Information ©2015 [employer]. This information is not intended to replace advice given to you by your health care provider. Make sure you discuss any questions you have with your health care provider. ° °First Trimester of Pregnancy °The first trimester of pregnancy is from week 1 until the end of week 12 (months 1 through 3). A week after a sperm fertilizes an egg, the egg will implant on the wall of the uterus. This embryo will begin to develop into a baby. Genes from you and your partner are forming the baby. The female genes determine whether the baby is a boy or a girl. At 6-8 weeks, the eyes and face are formed, and the heartbeat can be seen on ultrasound. At the end of 12 weeks, all the baby's organs are formed.  °Now that you are pregnant, you will want to do everything you can to have a healthy baby. Two of the most important things are to get good prenatal care and to follow your health care provider's instructions. Prenatal care is all the medical care you receive before the baby's birth. This care will help prevent, find, and treat any problems during the pregnancy and childbirth. °BODY CHANGES °Your body goes through many changes during pregnancy. The changes vary from woman to woman.  °· You may gain or lose a couple of pounds at first. °· You may feel sick to your stomach (nauseous) and throw   up (vomit). If the vomiting is uncontrollable, call your health care provider. °· You may tire easily. °· You may develop headaches that can be relieved by medicines approved by your health care provider. °· You may urinate more often. Painful urination may mean you have a bladder infection. °· You may develop heartburn as a result of your pregnancy. °· You may develop constipation because  certain hormones are causing the muscles that push waste through your intestines to slow down. °· You may develop hemorrhoids or swollen, bulging veins (varicose veins). °· Your breasts may begin to grow larger and become tender. Your nipples may stick out more, and the tissue that surrounds them (areola) may become darker. °· Your gums may bleed and may be sensitive to brushing and flossing. °· Dark spots or blotches (chloasma, mask of pregnancy) may develop on your face. This will likely fade after the baby is born. °· Your menstrual periods will stop. °· You may have a loss of appetite. °· You may develop cravings for certain kinds of food. °· You may have changes in your emotions from day to day, such as being excited to be pregnant or being concerned that something may go wrong with the pregnancy and baby. °· You may have more vivid and strange dreams. °· You may have changes in your hair. These can include thickening of your hair, rapid growth, and changes in texture. Some women also have hair loss during or after pregnancy, or hair that feels dry or thin. Your hair will most likely return to normal after your baby is born. °WHAT TO EXPECT AT YOUR PRENATAL VISITS °During a routine prenatal visit: °· You will be weighed to make sure you and the baby are growing normally. °· Your blood pressure will be taken. °· Your abdomen will be measured to track your baby's growth. °· The fetal heartbeat will be listened to starting around week 10 or 12 of your pregnancy. °· Test results from any previous visits will be discussed. °Your health care provider may ask you: °· How you are feeling. °· If you are feeling the baby move. °· If you have had any abnormal symptoms, such as leaking fluid, bleeding, severe headaches, or abdominal cramping. °· If you have any questions. °Other tests that may be performed during your first trimester include: °· Blood tests to find your blood type and to check for the presence of any  previous infections. They will also be used to check for low iron levels (anemia) and Rh antibodies. Later in the pregnancy, blood tests for diabetes will be done along with other tests if problems develop. °· Urine tests to check for infections, diabetes, or protein in the urine. °· An ultrasound to confirm the proper growth and development of the baby. °· An amniocentesis to check for possible genetic problems. °· Fetal screens for spina bifida and Down syndrome. °· You may need other tests to make sure you and the baby are doing well. °HOME CARE INSTRUCTIONS  °Medicines °· Follow your health care provider's instructions regarding medicine use. Specific medicines may be either safe or unsafe to take during pregnancy. °· Take your prenatal vitamins as directed. °· If you develop constipation, try taking a stool softener if your health care provider approves. °Diet °· Eat regular, well-balanced meals. Choose a variety of foods, such as meat or vegetable-based protein, fish, milk and low-fat dairy products, vegetables, fruits, and whole grain breads and cereals. Your health care provider will help you determine   the amount of weight gain that is right for you. °· Avoid raw meat and uncooked cheese. These carry germs that can cause birth defects in the baby. °· Eating four or five small meals rather than three large meals a day may help relieve nausea and vomiting. If you start to feel nauseous, eating a few soda crackers can be helpful. Drinking liquids between meals instead of during meals also seems to help nausea and vomiting. °· If you develop constipation, eat more high-fiber foods, such as fresh vegetables or fruit and whole grains. Drink enough fluids to keep your urine clear or pale yellow. °Activity and Exercise °· Exercise only as directed by your health care provider. Exercising will help you: °¨ Control your weight. °¨ Stay in shape. °¨ Be prepared for labor and delivery. °· Experiencing pain or cramping  in the lower abdomen or low back is a good sign that you should stop exercising. Check with your health care provider before continuing normal exercises. °· Try to avoid standing for long periods of time. Move your legs often if you must stand in one place for a long time. °· Avoid heavy lifting. °· Wear low-heeled shoes, and practice good posture. °· You may continue to have sex unless your health care provider directs you otherwise. °Relief of Pain or Discomfort °· Wear a good support bra for breast tenderness.   °· Take warm sitz baths to soothe any pain or discomfort caused by hemorrhoids. Use hemorrhoid cream if your health care provider approves.   °· Rest with your legs elevated if you have leg cramps or low back pain. °· If you develop varicose veins in your legs, wear support hose. Elevate your feet for 15 minutes, 3-4 times a day. Limit salt in your diet. °Prenatal Care °· Schedule your prenatal visits by the twelfth week of pregnancy. They are usually scheduled monthly at first, then more often in the last 2 months before delivery. °· Write down your questions. Take them to your prenatal visits. °· Keep all your prenatal visits as directed by your health care provider. °Safety °· Wear your seat belt at all times when driving. °· Make a list of emergency phone numbers, including numbers for family, friends, the hospital, and police and fire departments. °General Tips °· Ask your health care provider for a referral to a local prenatal education class. Begin classes no later than at the beginning of month 6 of your pregnancy. °· Ask for help if you have counseling or nutritional needs during pregnancy. Your health care provider can offer advice or refer you to specialists for help with various needs. °· Do not use hot tubs, steam rooms, or saunas. °· Do not douche or use tampons or scented sanitary pads. °· Do not cross your legs for long periods of time. °· Avoid cat litter boxes and soil used by cats.  These carry germs that can cause birth defects in the baby and possibly loss of the fetus by miscarriage or stillbirth. °· Avoid all smoking, herbs, alcohol, and medicines not prescribed by your health care provider. Chemicals in these affect the formation and growth of the baby. °· Schedule a dentist appointment. At home, brush your teeth with a soft toothbrush and be gentle when you floss. °SEEK MEDICAL CARE IF:  °· You have dizziness. °· You have mild pelvic cramps, pelvic pressure, or nagging pain in the abdominal area. °· You have persistent nausea, vomiting, or diarrhea. °· You have a bad smelling vaginal discharge. °·   You have pain with urination. °· You notice increased swelling in your face, hands, legs, or ankles. °SEEK IMMEDIATE MEDICAL CARE IF:  °· You have a fever. °· You are leaking fluid from your vagina. °· You have spotting or bleeding from your vagina. °· You have severe abdominal cramping or pain. °· You have rapid weight gain or loss. °· You vomit blood or material that looks like coffee grounds. °· You are exposed to German measles and have never had them. °· You are exposed to fifth disease or chickenpox. °· You develop a severe headache. °· You have shortness of breath. °· You have any kind of trauma, such as from a fall or a car accident. °Document Released: 09/11/2001 Document Revised: 02/01/2014 Document Reviewed: 07/28/2013 °ExitCare® Patient Information ©2015 [employer]. This information is not intended to replace advice given to you by your health care provider. Make sure you discuss any questions you have with your health care provider. ° °

## 2014-12-17 NOTE — MAU Note (Signed)
I'm pregnant, kind of bleeding' started last night.  Couple wks ago, bled after sex. No pain.  Just tired.

## 2014-12-18 LAB — HIV ANTIBODY (ROUTINE TESTING W REFLEX): HIV Screen 4th Generation wRfx: NONREACTIVE

## 2014-12-20 LAB — GC/CHLAMYDIA PROBE AMP (~~LOC~~) NOT AT ARMC
CHLAMYDIA, DNA PROBE: NEGATIVE
NEISSERIA GONORRHEA: NEGATIVE

## 2015-01-07 ENCOUNTER — Encounter (HOSPITAL_COMMUNITY): Payer: Self-pay | Admitting: Emergency Medicine

## 2015-01-07 ENCOUNTER — Emergency Department (INDEPENDENT_AMBULATORY_CARE_PROVIDER_SITE_OTHER)
Admission: EM | Admit: 2015-01-07 | Discharge: 2015-01-07 | Disposition: A | Discharge status: Home / Self Care | Payer: Self-pay | Source: Home / Self Care | Attending: Emergency Medicine | Admitting: Emergency Medicine

## 2015-01-07 DIAGNOSIS — R42 Dizziness and giddiness: Secondary | ICD-10-CM

## 2015-01-07 MED ORDER — MECLIZINE HCL 25 MG PO TABS: 25.0000 mg | ORAL_TABLET | Freq: Three times a day (TID) | ORAL | Status: DC | PRN

## 2015-01-07 NOTE — ED Notes (Signed)
Patient c/o headache with nausea and vomiting onset last week. Patient is [redacted] weeks pregnant. Patient reports this morning she was awoken from sleep around 2 am with dizziness. Patient is in NAD.

## 2015-01-07 NOTE — Discharge Instructions (Signed)
You have vertigo. Take meclizine 1 tablet every 8 hours as needed for dizziness or nausea. This medicine is safe to take in pregnancy.  It will also help with the nausea, allowing you to drink more fluids. This will resolve, typically in days to weeks. Follow-up with your OB provider as scheduled next week.

## 2015-01-07 NOTE — ED Provider Notes (Signed)
CSN: [id number]     Arrival date & time 01/07/15  1741 History   First MD Initiated Contact with Patient 01/07/15 1833     Chief Complaint  Patient presents with  . Migraine   (Consider location/radiation/quality/duration/timing/severity/associated sxs/prior Treatment) HPI  She is a 34 year old woman here for evaluation of dizziness. She is [redacted] weeks pregnant.  She states on Wednesday she had a migraine headache that was associated with nausea and vomiting. She states this was similar to headaches she had prior to pregnancy. On Thursday, the headache had resolved, but she had persistent dizziness. She describes the room spinning associated with severe nausea and vomiting whenever she moves her head.  She has [facility] appointment next week [facility]  Past Medical History  Diagnosis Date  . Medical history non-contributory    Past Surgical History  Procedure Laterality Date  . No past surgeries     No family history on file. History  Substance Use Topics  . Smoking status: Never Smoker   . Smokeless tobacco: Not on file  . Alcohol Use: No   OB History    Gravida Para Term Preterm AB TAB SAB Ectopic Multiple Living   3 2 2       4      Review of Systems  Constitutional: Negative for fever.  HENT: Negative.   Cardiovascular: Negative.   Gastrointestinal: Positive for nausea and vomiting.  Neurological: Positive for dizziness. Negative for headaches.    Allergies  Review of patient's allergies indicates no known allergies.  Home Medications   Prior to Admission medications   Medication Sig Start Date End Date Taking? Authorizing Provider  Cholecalciferol (VITAMIN D PO) Take 1 tablet by mouth 2 (two) times a week.    Historical Provider, MD  meclizine (ANTIVERT) 25 MG tablet Take 1 tablet (25 mg total) by mouth 3 (three) times daily as needed for dizziness. 01/07/15   Charm RingsErin J Melvena Vink, MD  Prenatal Vit-Fe Fumarate-FA (PRENATAL MULTIVITAMIN) TABS tablet Take 1  tablet by mouth daily at 12 noon.    Historical Provider, MD   BP 115/74 mmHg  Pulse 83  Temp(Src) 98.4 F (36.9 C) (Oral)  Resp 16  SpO2 98%  LMP 10/20/2014 (Approximate) Physical Exam  Constitutional: She is oriented to person, place, and time. She appears well-developed and well-nourished. No distress.  HENT:  Lips are dry  Neck: Neck supple.  Cardiovascular: Normal rate.   Pulmonary/Chest: Effort normal.  Neurological: She is alert and oriented to person, place, and time.    ED Course  Procedures (including critical care time) Labs Review Labs Reviewed - No data to display  Imaging Review No results found.   MDM   1. Vertigo    We'll treat with meclizine 3 times a day when necessary. Handout on Epley maneuver given. Follow-up with OB provider as scheduled next week.    Charm RingsErin J Vince Ainsley, MD 01/07/15 1900

## 2015-04-21 ENCOUNTER — Other Ambulatory Visit: Payer: Self-pay | Admitting: Obstetrics and Gynecology

## 2015-04-22 LAB — CYTOLOGY - PAP

## 2015-04-28 LAB — OB RESULTS CONSOLE GC/CHLAMYDIA
CHLAMYDIA, DNA PROBE: NEGATIVE
GC PROBE AMP, GENITAL: NEGATIVE

## 2015-04-28 LAB — OB RESULTS CONSOLE HEPATITIS B SURFACE ANTIGEN: Hepatitis B Surface Ag: NEGATIVE

## 2015-04-28 LAB — OB RESULTS CONSOLE ABO/RH: RH Type: POSITIVE

## 2015-04-28 LAB — OB RESULTS CONSOLE RPR: RPR: NONREACTIVE

## 2015-04-28 LAB — OB RESULTS CONSOLE ANTIBODY SCREEN: ANTIBODY SCREEN: NEGATIVE

## 2015-04-28 LAB — OB RESULTS CONSOLE RUBELLA ANTIBODY, IGM: RUBELLA: IMMUNE

## 2015-04-28 LAB — OB RESULTS CONSOLE HIV ANTIBODY (ROUTINE TESTING): HIV: NONREACTIVE

## 2015-07-13 ENCOUNTER — Other Ambulatory Visit: Payer: Self-pay | Admitting: Obstetrics and Gynecology

## 2015-07-13 LAB — OB RESULTS CONSOLE GBS: GBS: NEGATIVE

## 2015-07-19 ENCOUNTER — Inpatient Hospital Stay (HOSPITAL_COMMUNITY)
Admission: AD | Admit: 2015-07-19 | Discharge: 2015-07-21 | DRG: 775 | Disposition: A | Discharge status: Home / Self Care | Payer: Medicaid Other | Source: Ambulatory Visit | Attending: Obstetrics and Gynecology | Admitting: Obstetrics and Gynecology

## 2015-07-19 ENCOUNTER — Inpatient Hospital Stay (HOSPITAL_COMMUNITY): Payer: Medicaid Other | Admitting: Anesthesiology

## 2015-07-19 ENCOUNTER — Encounter (HOSPITAL_COMMUNITY): Payer: Self-pay | Admitting: *Deleted

## 2015-07-19 DIAGNOSIS — O99013 Anemia complicating pregnancy, third trimester: Principal | ICD-10-CM | POA: Diagnosis present

## 2015-07-19 DIAGNOSIS — O0933 Supervision of pregnancy with insufficient antenatal care, third trimester: Secondary | ICD-10-CM | POA: Diagnosis present

## 2015-07-19 DIAGNOSIS — IMO0001 Reserved for inherently not codable concepts without codable children: Secondary | ICD-10-CM

## 2015-07-19 DIAGNOSIS — Z3A37 37 weeks gestation of pregnancy: Secondary | ICD-10-CM

## 2015-07-19 DIAGNOSIS — D560 Alpha thalassemia: Secondary | ICD-10-CM | POA: Diagnosis present

## 2015-07-19 DIAGNOSIS — O09523 Supervision of elderly multigravida, third trimester: Secondary | ICD-10-CM

## 2015-07-19 DIAGNOSIS — Z3480 Encounter for supervision of other normal pregnancy, unspecified trimester: Secondary | ICD-10-CM

## 2015-07-19 LAB — CBC
HEMATOCRIT: 31.4 % — AB (ref 36.0–46.0)
HEMOGLOBIN: 9.8 g/dL — AB (ref 12.0–15.0)
MCH: 24.1 pg — AB (ref 26.0–34.0)
MCHC: 31.2 g/dL (ref 30.0–36.0)
MCV: 77.3 fL — ABNORMAL LOW (ref 78.0–100.0)
Platelets: 195 10*3/uL (ref 150–400)
RBC: 4.06 MIL/uL (ref 3.87–5.11)
RDW: 16.3 % — ABNORMAL HIGH (ref 11.5–15.5)
WBC: 10.5 10*3/uL (ref 4.0–10.5)

## 2015-07-19 LAB — TYPE AND SCREEN
ABO/RH(D): B POS
Antibody Screen: NEGATIVE

## 2015-07-19 LAB — RPR: RPR: NONREACTIVE

## 2015-07-19 MED ORDER — PHENYLEPHRINE 40 MCG/ML (10ML) SYRINGE FOR IV PUSH (FOR BLOOD PRESSURE SUPPORT)
PREFILLED_SYRINGE | INTRAVENOUS | Status: AC
  Filled 2015-07-19: qty 20

## 2015-07-19 MED ORDER — TETANUS-DIPHTH-ACELL PERTUSSIS 5-2.5-18.5 LF-MCG/0.5 IM SUSP: 0.5000 mL | Freq: Once | INTRAMUSCULAR | Status: DC

## 2015-07-19 MED ORDER — EPHEDRINE 5 MG/ML INJ
10.0000 mg | INTRAVENOUS | Status: DC | PRN
  Filled 2015-07-19: qty 2

## 2015-07-19 MED ORDER — OXYTOCIN 40 UNITS IN LACTATED RINGERS INFUSION - SIMPLE MED
62.5000 mL/h | INTRAVENOUS | Status: DC
  Filled 2015-07-19: qty 1000

## 2015-07-19 MED ORDER — FENTANYL 2.5 MCG/ML BUPIVACAINE 1/10 % EPIDURAL INFUSION (WH - ANES)
INTRAMUSCULAR | Status: AC
  Filled 2015-07-19: qty 125

## 2015-07-19 MED ORDER — LACTATED RINGERS IV SOLN
500.0000 mL | INTRAVENOUS | Status: DC | PRN
  Administered 2015-07-19: 500 mL via INTRAVENOUS

## 2015-07-19 MED ORDER — PHENYLEPHRINE 40 MCG/ML (10ML) SYRINGE FOR IV PUSH (FOR BLOOD PRESSURE SUPPORT)
80.0000 ug | PREFILLED_SYRINGE | INTRAVENOUS | Status: DC | PRN
  Filled 2015-07-19: qty 2

## 2015-07-19 MED ORDER — IBUPROFEN 600 MG PO TABS
600.0000 mg | ORAL_TABLET | Freq: Four times a day (QID) | ORAL | Status: DC
  Administered 2015-07-19 – 2015-07-21 (×6): 600 mg via ORAL
  Filled 2015-07-19 (×7): qty 1

## 2015-07-19 MED ORDER — ZOLPIDEM TARTRATE 5 MG PO TABS: 5.0000 mg | ORAL_TABLET | Freq: Every evening | ORAL | Status: DC | PRN

## 2015-07-19 MED ORDER — ONDANSETRON HCL 4 MG PO TABS
4.0000 mg | ORAL_TABLET | ORAL | Status: DC | PRN
Start: 2015-07-19 — End: 2015-07-21

## 2015-07-19 MED ORDER — WITCH HAZEL-GLYCERIN EX PADS: 1.0000 "application " | MEDICATED_PAD | CUTANEOUS | Status: DC | PRN

## 2015-07-19 MED ORDER — LIDOCAINE HCL (PF) 1 % IJ SOLN
30.0000 mL | INTRAMUSCULAR | Status: DC | PRN
  Filled 2015-07-19: qty 30

## 2015-07-19 MED ORDER — FENTANYL 2.5 MCG/ML BUPIVACAINE 1/10 % EPIDURAL INFUSION (WH - ANES)
14.0000 mL/h | INTRAMUSCULAR | Status: DC | PRN
  Administered 2015-07-19: 14 mL/h via EPIDURAL
  Administered 2015-07-19: 12.5 mL/h via EPIDURAL

## 2015-07-19 MED ORDER — MEASLES, MUMPS & RUBELLA VAC ~~LOC~~ INJ
0.5000 mL | INJECTION | Freq: Once | SUBCUTANEOUS | Status: DC
  Filled 2015-07-19: qty 0.5

## 2015-07-19 MED ORDER — LACTATED RINGERS IV SOLN
INTRAVENOUS | Status: DC
  Administered 2015-07-19: 07:00:00 via INTRAVENOUS

## 2015-07-19 MED ORDER — OXYCODONE-ACETAMINOPHEN 5-325 MG PO TABS: 2.0000 | ORAL_TABLET | ORAL | Status: DC | PRN

## 2015-07-19 MED ORDER — FLEET ENEMA 7-19 GM/118ML RE ENEM: 1.0000 | ENEMA | RECTAL | Status: DC | PRN

## 2015-07-19 MED ORDER — ONDANSETRON HCL 4 MG/2ML IJ SOLN: 4.0000 mg | INTRAMUSCULAR | Status: DC | PRN

## 2015-07-19 MED ORDER — CITRIC ACID-SODIUM CITRATE 334-500 MG/5ML PO SOLN: 30.0000 mL | ORAL | Status: DC | PRN

## 2015-07-19 MED ORDER — LIDOCAINE HCL (PF) 1 % IJ SOLN
INTRAMUSCULAR | Status: DC | PRN
  Administered 2015-07-19 (×2): 4 mL

## 2015-07-19 MED ORDER — DIBUCAINE 1 % RE OINT: 1.0000 "application " | TOPICAL_OINTMENT | RECTAL | Status: DC | PRN

## 2015-07-19 MED ORDER — SENNOSIDES-DOCUSATE SODIUM 8.6-50 MG PO TABS
2.0000 | ORAL_TABLET | ORAL | Status: DC
  Administered 2015-07-20 (×2): 2 via ORAL
  Filled 2015-07-19 (×2): qty 2

## 2015-07-19 MED ORDER — ONDANSETRON HCL 4 MG/2ML IJ SOLN: 4.0000 mg | Freq: Four times a day (QID) | INTRAMUSCULAR | Status: DC | PRN

## 2015-07-19 MED ORDER — ACETAMINOPHEN 325 MG PO TABS: 650.0000 mg | ORAL_TABLET | ORAL | Status: DC | PRN

## 2015-07-19 MED ORDER — OXYCODONE-ACETAMINOPHEN 5-325 MG PO TABS: 1.0000 | ORAL_TABLET | ORAL | Status: DC | PRN

## 2015-07-19 MED ORDER — BENZOCAINE-MENTHOL 20-0.5 % EX AERO: 1.0000 "application " | INHALATION_SPRAY | CUTANEOUS | Status: DC | PRN

## 2015-07-19 MED ORDER — OXYTOCIN BOLUS FROM INFUSION: 500.0000 mL | INTRAVENOUS | Status: DC

## 2015-07-19 MED ORDER — OXYCODONE-ACETAMINOPHEN 5-325 MG PO TABS
1.0000 | ORAL_TABLET | ORAL | Status: DC | PRN
  Administered 2015-07-19 – 2015-07-20 (×2): 1 via ORAL
  Filled 2015-07-19 (×2): qty 1

## 2015-07-19 MED ORDER — DIPHENHYDRAMINE HCL 50 MG/ML IJ SOLN: 12.5000 mg | INTRAMUSCULAR | Status: DC | PRN

## 2015-07-19 MED ORDER — SIMETHICONE 80 MG PO CHEW: 80.0000 mg | CHEWABLE_TABLET | ORAL | Status: DC | PRN

## 2015-07-19 NOTE — MAU Note (Signed)
PT  SAYS  SHE HAS BEEN [facility]  SINCE   0200.  VE  - 3  CM  .   DENIES HSV AND MRSA.  GBS-  COLLECTED ON LAST WED

## 2015-07-19 NOTE — Anesthesia Procedure Notes (Signed)
Epidural Patient location during procedure: OB  Staffing Anesthesiologist: Joanette Silveria Performed by: anesthesiologist   Preanesthetic Checklist Completed: patient identified, site marked, surgical consent, pre-op evaluation, timeout performed, IV checked, risks and benefits discussed and monitors and equipment checked  Epidural Patient position: sitting Prep: site prepped and draped and DuraPrep Patient monitoring: continuous pulse ox and blood pressure Approach: midline Location: L3-L4 Injection technique: LOR saline  Needle:  Needle type: Tuohy  Needle gauge: 17 G Needle length: 9 cm and 9 Needle insertion depth: 4 cm Catheter type: closed end flexible Catheter size: 19 Gauge Catheter at skin depth: 9 cm Test dose: negative  Assessment Events: blood not aspirated, injection not painful, no injection resistance, negative IV test and no paresthesia  Additional Notes Patient identified. Risks/Benefits/Options discussed with patient including but not limited to bleeding, infection, nerve damage, paralysis, failed block, incomplete pain control, headache, blood pressure changes, nausea, vomiting, reactions to medication both or allergic, itching and postpartum back pain. Confirmed with bedside nurse the patient's most recent platelet count. Confirmed with patient that they are not currently taking any anticoagulation, have any bleeding history or any family history of bleeding disorders. Patient expressed understanding and wished to proceed. All questions were answered. Sterile technique was used throughout the entire procedure. Please see nursing notes for vital signs. Test dose was given through epidural catheter and negative prior to continuing to dose epidural or start infusion. Warning signs of high block given to the patient including shortness of breath, tingling/numbness in hands, complete motor block, or any concerning symptoms with instructions to call for help. Patient was  given instructions on fall risk and not to get out of bed. All questions and concerns addressed with instructions to call with any issues or inadequate analgesia.      

## 2015-07-19 NOTE — Progress Notes (Signed)
[facility] to inquire who was back up for Dr Dareen PianoAnderson. No MD scheduled as back up.

## 2015-07-19 NOTE — Progress Notes (Signed)
I was called to the patient's room as the patient was crowning and the patient's physician was en route. When I arrived in the room the patient's nurse was at bedside with her hand on the crowning fetal head. I gloved and as soon as I placed my hand on the baby's head, the infant delivered. The patient's provider then entered the room and assumed care.

## 2015-07-19 NOTE — Anesthesia Preprocedure Evaluation (Signed)
Anesthesia Evaluation  Patient identified by MRN, date of birth, ID band Patient awake    Reviewed: Allergy & Precautions, NPO status , Patient's Chart, lab work & pertinent test results  History of Anesthesia Complications Negative for: history of anesthetic complications  Airway Mallampati: II  TM Distance: >3 FB Neck ROM: Full    Dental no notable dental hx. (+) Dental Advisory Given   Pulmonary neg pulmonary ROS,    Pulmonary exam normal breath sounds clear to auscultation       Cardiovascular negative cardio ROS Normal cardiovascular exam Rhythm:Regular Rate:Normal     Neuro/Psych negative neurological ROS  negative psych ROS   GI/Hepatic negative GI ROS, Neg liver ROS,   Endo/Other  negative endocrine ROS  Renal/GU negative Renal ROS  negative genitourinary   Musculoskeletal negative musculoskeletal ROS (+)   Abdominal   Peds negative pediatric ROS (+)  Hematology negative hematology ROS (+)   Anesthesia Other Findings   Reproductive/Obstetrics (+) Pregnancy                             Anesthesia Physical Anesthesia Plan  ASA: II  Anesthesia Plan: Epidural   Post-op Pain Management:    Induction:   Airway Management Planned:   Additional Equipment:   Intra-op Plan:   Post-operative Plan:   Informed Consent: I have reviewed the patients History and Physical, chart, labs and discussed the procedure including the risks, benefits and alternatives for the proposed anesthesia with the patient or authorized representative who has indicated his/her understanding and acceptance.     Plan Discussed with:   Anesthesia Plan Comments:         Anesthesia Quick Evaluation

## 2015-07-19 NOTE — Progress Notes (Signed)
Pt is a 34 y/o asian female G3P2002 at term who presents to L&D in labor. [facility] She has alpha thallasemia. She was 7 cm on admission.  PMHX: see hollister. PE: VSSAF. HEENT-wnl ABD-gravid, non tender, palp ctxs FHTs- reactive IMP/ IUP at term in labor Plan/ Admit

## 2015-07-20 NOTE — Progress Notes (Addendum)
Patient is eating, ambulating, voiding.  Pain control is good.  Filed Vitals:   07/19/15 1300 07/19/15 1635 07/20/15 0030 07/20/15 0630  BP: 111/77 123/73 119/75 110/75  Pulse: 88 97 92 103  Temp: 98.2 F (36.8 C) 99.3 F (37.4 C) 98.8 F (37.1 C) 98.5 F (36.9 C)  TempSrc: Oral Oral Oral   Resp: 18 18 18 18   Height:      Weight:      SpO2:  99%      Fundus firm Perineum without swelling.  Lab Results  Component Value Date   WBC 10.5 07/19/2015   HGB 9.8* 07/19/2015   HCT 31.4* 07/19/2015   MCV 77.3* 07/19/2015   PLT 195 07/19/2015    --/--/B POS (10/18 0630)/RI  A/P Post partum day 1.  Routine care.  Expect d/c routine.  Parents desire circ but need to settle account with hosptital first.  Erika Little A

## 2015-07-20 NOTE — Anesthesia Postprocedure Evaluation (Signed)
  Anesthesia Post-op Note  Patient: Erika Little  Procedure(s) Performed: * No procedures listed *  Patient Location: Mother/Baby  Anesthesia Type:Epidural  Level of Consciousness: awake, alert  and oriented  Airway and Oxygen Therapy: Patient Spontanous Breathing  Post-op Pain: none  Post-op Assessment: Post-op Vital signs reviewed, Patient's Cardiovascular Status Stable, Respiratory Function Stable, Patent Airway, No signs of Nausea or vomiting, Adequate PO intake, Pain level controlled and No headache              Post-op Vital Signs: Reviewed and stable  Last Vitals:  Filed Vitals:   07/20/15 0630  BP: 110/75  Pulse: 103  Temp: 36.9 C  Resp: 18    Complications: No apparent anesthesia complications

## 2015-07-20 NOTE — Progress Notes (Signed)
UR chart review completed.  

## 2015-07-21 MED ORDER — IBUPROFEN 600 MG PO TABS: 600.0000 mg | ORAL_TABLET | Freq: Four times a day (QID) | ORAL | Status: DC

## 2015-07-21 NOTE — Discharge Instructions (Signed)

## 2015-07-21 NOTE — Progress Notes (Signed)
Patient is eating, ambulating, voiding.  Pain control is good.  Bleeding minimal  Filed Vitals:   07/20/15 0630 07/20/15 1125 07/20/15 1849 07/21/15 0600  BP: 110/75  112/70 108/71  Pulse: 103  96 101  Temp: 98.5 F (36.9 C) 98.9 F (37.2 C) 98.5 F (36.9 C) 98.1 F (36.7 C)  TempSrc:  Oral Oral   Resp: 18  18 18   Height:      Weight:      SpO2:        Fundus firm Ext: symmetric, no edema  Lab Results  Component Value Date   WBC 10.5 07/19/2015   HGB 9.8* 07/19/2015   HCT 31.4* 07/19/2015   MCV 77.3* 07/19/2015   PLT 195 07/19/2015    --/--/B POS (10/18 0630)/RI  A/P G3p3 PPD#2 s/p TSVD Meeting all goals, d/c today  [facility] GEFFEL [employer] [facility]

## 2015-07-21 NOTE — Discharge Summary (Signed)
Obstetric Discharge Summary Reason for Admission: onset of labor Prenatal Procedures: none Intrapartum Procedures: spontaneous vaginal delivery Postpartum Procedures: none Complications-Operative and Postpartum: none HEMOGLOBIN  Date Value Ref Range Status  07/19/2015 9.8* 12.0 - 15.0 g/dL Final   HCT  Date Value Ref Range Status  07/19/2015 31.4* 36.0 - 46.0 % Final    Physical Exam:  General: alert, cooperative and appears stated age Lochia: appropriate Uterine Fundus: firm DVT Evaluation: No evidence of DVT seen on physical exam. Negative Homan's sign.  Discharge Diagnoses: Term Pregnancy-delivered  Discharge Information: Date: 07/21/2015 Activity: pelvic rest Diet: routine Medications: PNV and Ibuprofen Condition: stable Instructions: refer to practice specific booklet Discharge to: home Follow-up Information    Follow up with Levi AlandANDERSON,MARK E, MD In 4 weeks.   Specialty:  Obstetrics and Gynecology   Contact information:   [address] [place] [id number] [phone]       Newborn Data: Live born female  Birth Weight: 6 lb 12.6 oz (3080 g) APGAR: 8, 9  Home with mother.  Rodrigues Urbanek GEFFEL Corbin Falck 07/21/2015, 8:42 AM

## 2015-07-26 NOTE — H&P (Signed)
34 yo G3P2002 @ 37+4 presents for labor  Her pregnancy has been complicated by late prenatal care and AMA. Due to late prenatal care no genetic screening was performed. AFVSS Category 1 tracing toco Q4  A/P 1) Admit 2) Epidural on request 3) Anticipate svd

## 2016-09-19 ENCOUNTER — Ambulatory Visit (HOSPITAL_COMMUNITY)
Admission: EM | Admit: 2016-09-19 | Discharge: 2016-09-19 | Disposition: A | Discharge status: Home / Self Care | Payer: Medicaid Other | Attending: Emergency Medicine | Admitting: Emergency Medicine

## 2016-09-19 ENCOUNTER — Encounter (HOSPITAL_COMMUNITY): Payer: Self-pay | Admitting: Emergency Medicine

## 2016-09-19 DIAGNOSIS — J320 Chronic maxillary sinusitis: Secondary | ICD-10-CM | POA: Diagnosis not present

## 2016-09-19 MED ORDER — AMOXICILLIN 500 MG PO CAPS: 500.0000 mg | ORAL_CAPSULE | Freq: Three times a day (TID) | ORAL | 0 refills | Status: DC

## 2016-09-19 NOTE — ED Triage Notes (Signed)
Pt complains of sinus pressure for one week, nausea today, fatigue and chills.

## 2016-09-19 NOTE — ED Provider Notes (Signed)
CSN: [id number]     Arrival date & time 09/19/16  1334 History   None    Chief Complaint  Patient presents with  . Recurrent Sinusitis   (Consider location/radiation/quality/duration/timing/severity/associated sxs/prior Treatment) The history is provided by the patient. No language interpreter was used.  Cough  Cough characteristics:  Productive Sputum characteristics:  Green Severity:  Moderate Onset quality:  Gradual Duration:  4 days Timing:  Constant Progression:  Worsening Chronicity:  New Smoker: no   Context: upper respiratory infection   Worsened by:  Nothing Ineffective treatments:  None tried Associated symptoms: headaches, sinus congestion and sore throat   Risk factors: no recent infection     Past Medical History:  Diagnosis Date  . Medical history non-contributory    Past Surgical History:  Procedure Laterality Date  . NO PAST SURGERIES     History reviewed. No pertinent family history. Social History  Substance Use Topics  . Smoking status: Never Smoker  . Smokeless tobacco: Never Used  . Alcohol use No   OB History    Gravida Para Term Preterm AB Living   3 3 3     3    SAB TAB Ectopic Multiple Live Births         0 3     Review of Systems  HENT: Positive for sore throat.   Respiratory: Positive for cough.   Neurological: Positive for headaches.  All other systems reviewed and are negative.   Allergies  Patient has no known allergies.  Home Medications   Prior to Admission medications   Medication Sig Start Date End Date Taking? Authorizing Provider  amoxicillin (AMOXIL) 500 MG capsule Take 1 capsule (500 mg total) by mouth 3 (three) times daily. 09/19/16   Elson AreasLeslie K Maryalyce Sanjuan, PA-C  ibuprofen (ADVIL,MOTRIN) 600 MG tablet Take 1 tablet (600 mg total) by mouth every 6 (six) hours. 07/21/15   Marlow Baarsyanna Clark, MD  Prenatal Vit-Fe Fumarate-FA (PRENATAL MULTIVITAMIN) TABS tablet Take 1 tablet by mouth daily at 12 noon.    Historical Provider, MD    Meds Ordered and Administered this Visit  Medications - No data to display  BP 127/85 (BP Location: Right Arm)   Pulse 89   Temp 98.1 F (36.7 C) (Oral)   LMP 08/29/2016 (Approximate)   SpO2 97%  No data found.   Physical Exam  Constitutional: She is oriented to person, place, and time. She appears well-developed and well-nourished.  HENT:  Head: Normocephalic and atraumatic.  Mouth/Throat: Posterior oropharyngeal erythema present.  Tender maxillary sinuses,  Eyes: Conjunctivae and EOM are normal. Pupils are equal, round, and reactive to light.  Neck: Normal range of motion. Neck supple.  Pulmonary/Chest: Effort normal.  Abdominal: Soft.  Musculoskeletal: Normal range of motion.  Neurological: She is alert and oriented to person, place, and time.  Skin: Skin is warm and dry.  Psychiatric: She has a normal mood and affect.    Urgent Care Course   Clinical Course     Procedures (including critical care time)  Labs Review Labs Reviewed - No data to display  Imaging Review No results found.   Visual Acuity Review  Right Eye Distance:   Left Eye Distance:   Bilateral Distance:    Right Eye Near:   Left Eye Near:    Bilateral Near:         MDM   1. Maxillary sinusitis, unspecified chronicity    Meds ordered this encounter  Medications  . amoxicillin (AMOXIL) 500 MG capsule  Sig: Take 1 capsule (500 mg total) by mouth 3 (three) times daily.    Dispense:  30 capsule    Refill:  0    Order Specific Question:   Supervising Provider    Answer:   Linna HoffKINDL, JAMES D [id number]]   An After Visit Summary was printed and given to the patient.   Lonia SkinnerLeslie K RockledgeSofia, PA-C 09/19/16 1744

## 2016-09-19 NOTE — Discharge Instructions (Signed)
Return if any problems.  See your Physician for recheck in 1 week if symptoms persist. °

## 2017-01-13 IMAGING — US US OB COMP LESS 14 WK
1 series · 14 of 28 positions shown · non-contrast
Comparison: None.

CLINICAL DATA: Vaginal bleeding, estimated gestational age by last
menstrual period equals 8 weeks 2 days.

EXAM:
OBSTETRIC <14 WK US AND TRANSVAGINAL OB US
TECHNIQUE: Both transabdominal and transvaginal ultrasound examinations were
performed for complete evaluation of the gestation as well as the
maternal uterus, adnexal regions, and pelvic cul-de-sac.
Transvaginal technique was performed to assess early pregnancy.

[Series 1: us ob comp less 14 wks · 81 acquisitions, 14 frames shown]
[im 3/81]
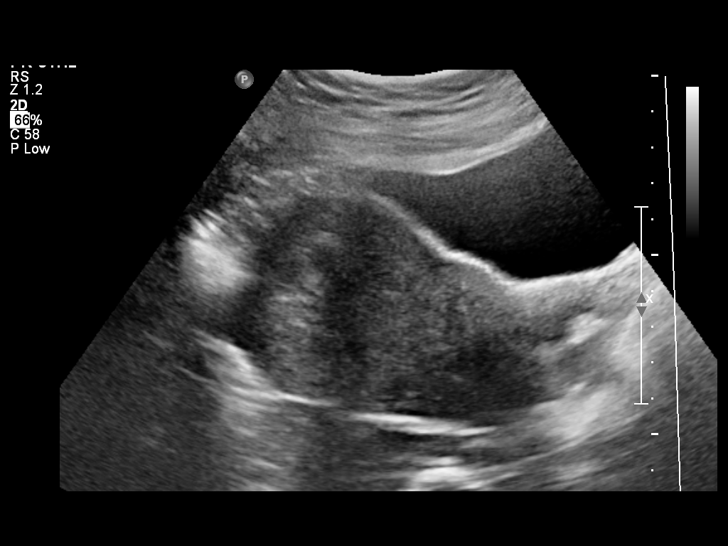
[im 9/81]
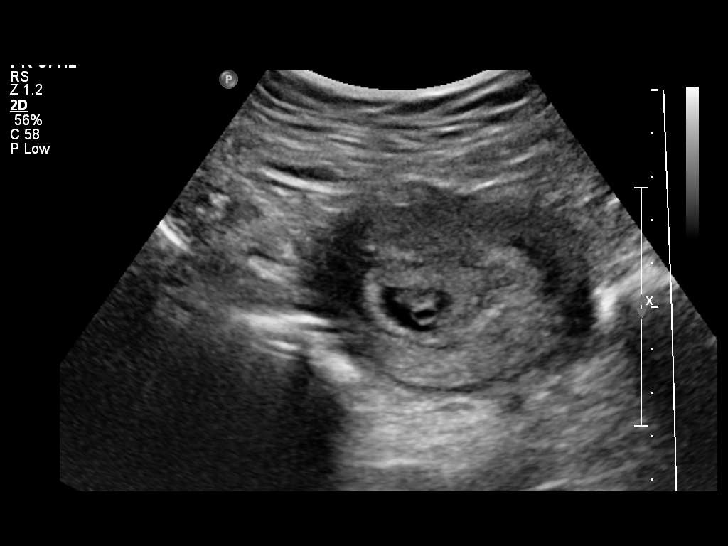
[im 15/81]
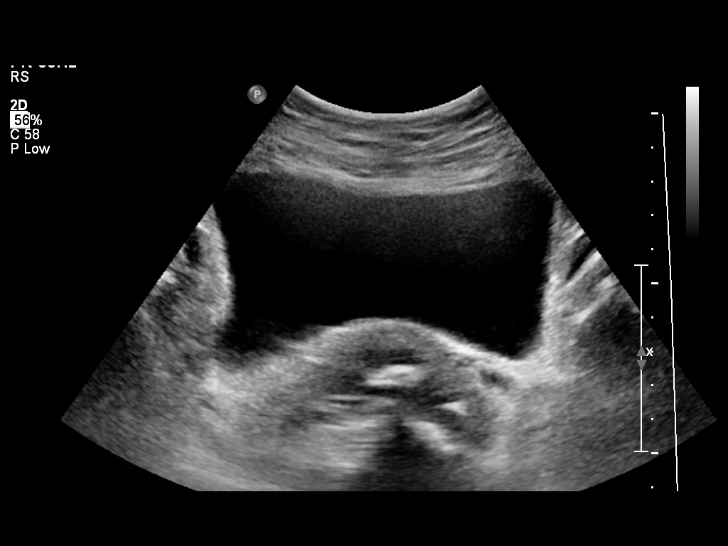
[im 21/81]
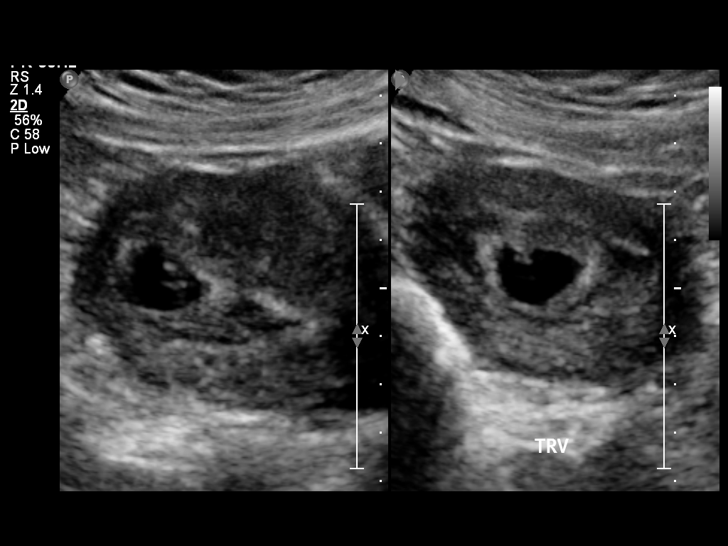
[im 27/81]
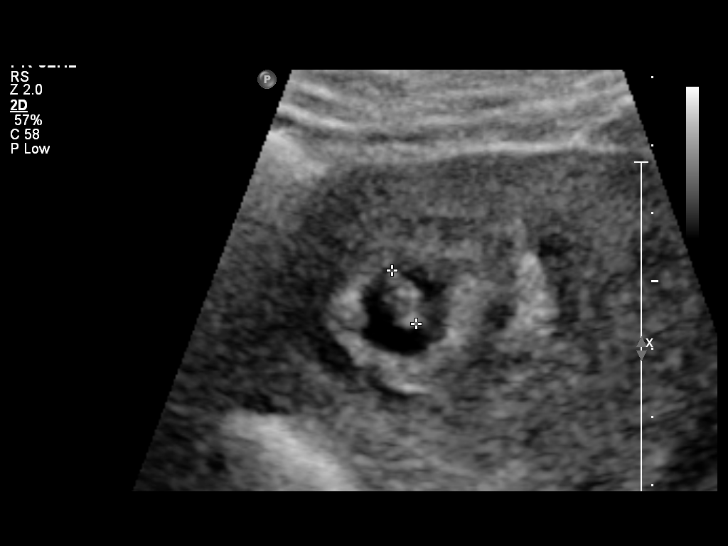
[im 33/81]
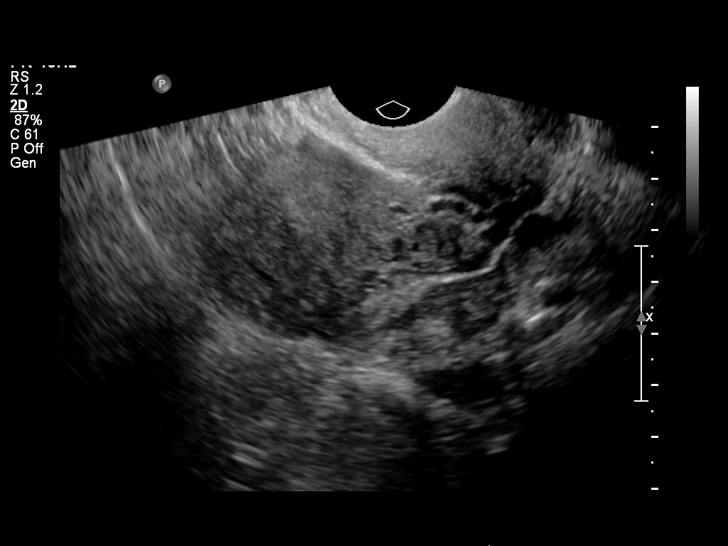
[im 39/81]
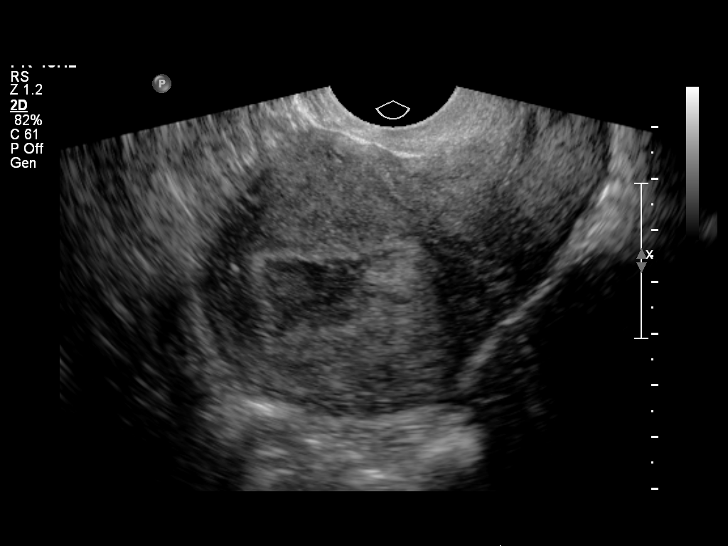
[im 45/81]
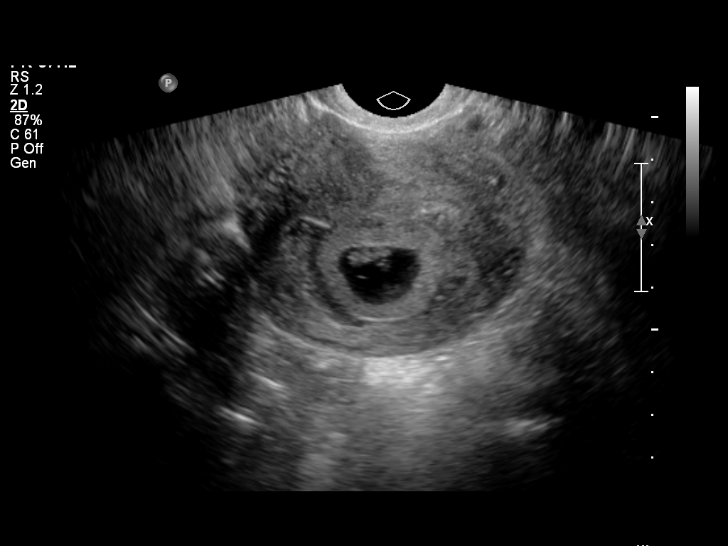
[im 51/81]
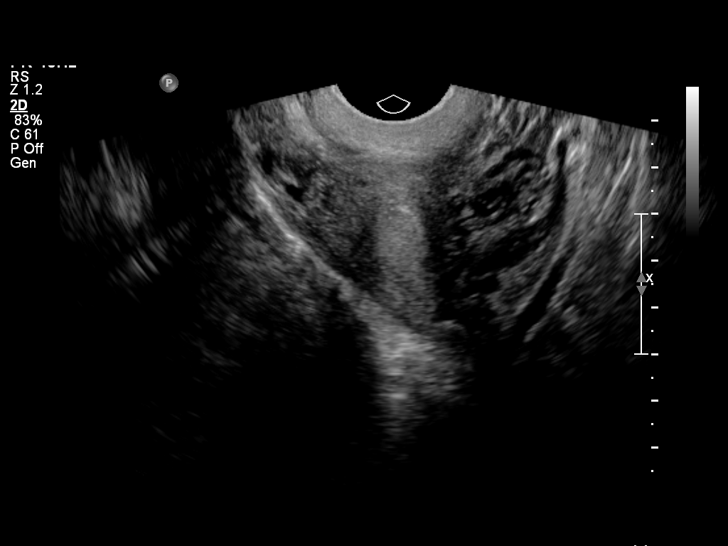
[im 57/81]
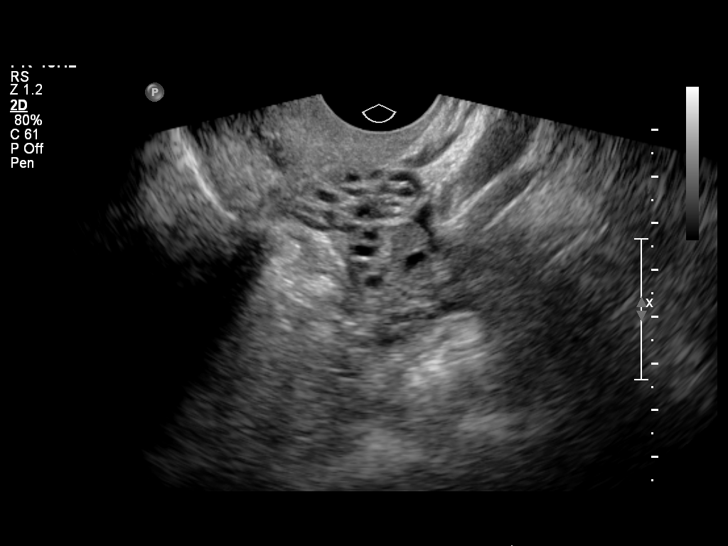
[im 63/81]
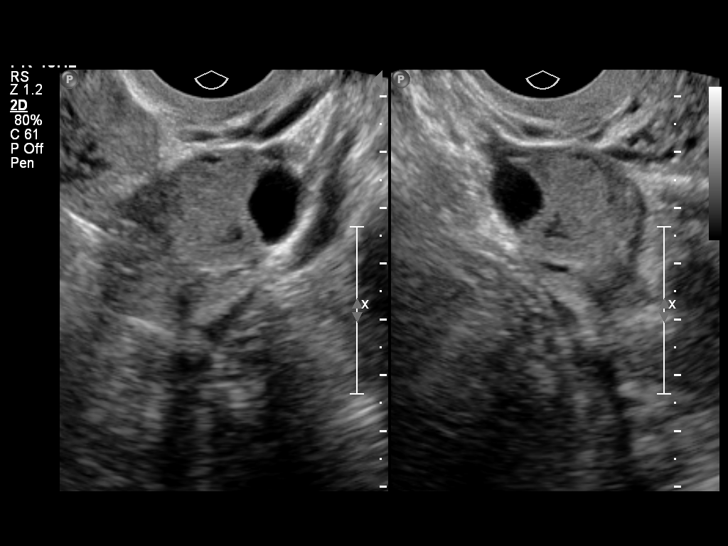
[im 69/81]
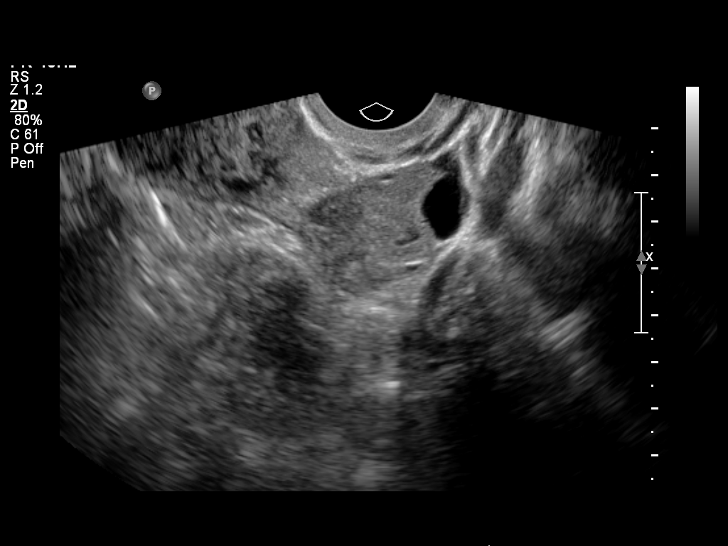
[im 75/81]
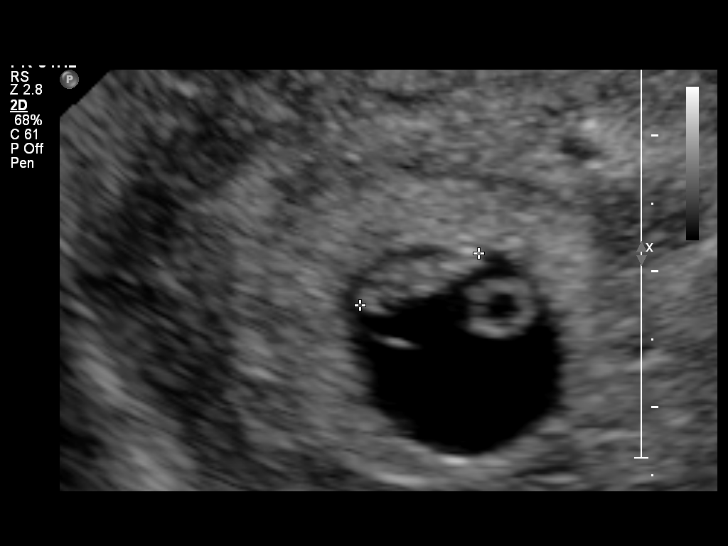
[im 81/81]
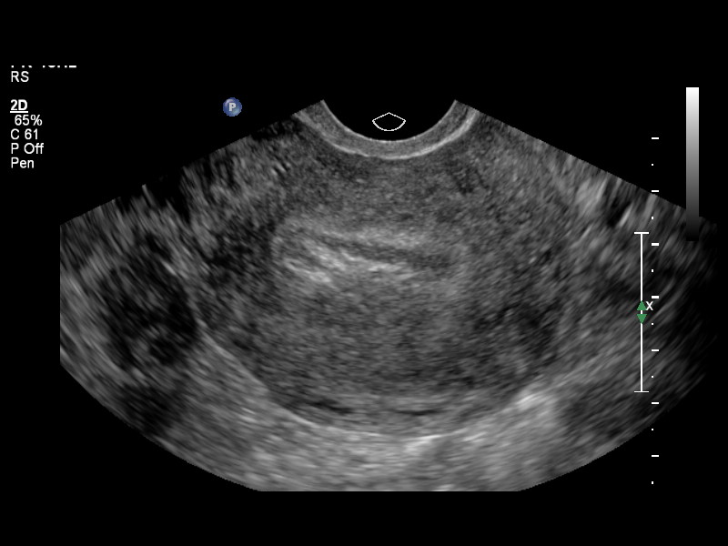

[14 of 28 positions shown; findings below may reference images not displayed]

FINDINGS: Intrauterine gestational sac: Single

Yolk sac:  Present

Embryo:  Present

Cardiac Activity: Present

Heart Rate: 137  bpm

CRL:  9.3  mm   7 w   0 d                  US EDC: 08/05/2015

Maternal uterus/adnexae: Moderate subchorionic hemorrhage noted.
Corpus luteal cyst of the right ovary. No free fluid.
IMPRESSION: 1. Single intrauterine gestation with embryo and normal cardiac
activity.

2. Estimated gestational age by crown rump length equals 7 weeks 0
days.

3. Moderate subchorionic hemorrhage

## 2017-02-19 ENCOUNTER — Encounter (HOSPITAL_COMMUNITY): Payer: Self-pay | Admitting: *Deleted

## 2017-02-19 ENCOUNTER — Ambulatory Visit (HOSPITAL_COMMUNITY)
Admission: EM | Admit: 2017-02-19 | Discharge: 2017-02-19 | Disposition: A | Discharge status: Home / Self Care | Payer: Medicaid Other | Attending: Family Medicine | Admitting: Family Medicine

## 2017-02-19 DIAGNOSIS — J0100 Acute maxillary sinusitis, unspecified: Secondary | ICD-10-CM

## 2017-02-19 MED ORDER — IPRATROPIUM BROMIDE 0.06 % NA SOLN: 2.0000 | Freq: Four times a day (QID) | NASAL | 0 refills | Status: DC

## 2017-02-19 MED ORDER — AZITHROMYCIN 250 MG PO TABS: 250.0000 mg | ORAL_TABLET | Freq: Every day | ORAL | 0 refills | Status: DC

## 2017-02-19 MED ORDER — METHYLPREDNISOLONE 4 MG PO TBPK: ORAL_TABLET | ORAL | 0 refills | Status: DC

## 2017-02-19 NOTE — ED Triage Notes (Signed)
Started  Having  Nasal  Congestion       Headache       Runny  Nose   At time s  As    Well  As  Facial  Pain  Which  She  Describes     As  painfull  Teeth     She  Is  Awake  As   Well  As  Alert  And  Oriented

## 2017-02-19 NOTE — ED Provider Notes (Signed)
CSN: [id number]     Arrival date & time 02/19/17  7829 History   None    Chief Complaint  Patient presents with  . URI   (Consider location/radiation/quality/duration/timing/severity/associated sxs/prior Treatment) Patient c/o nasal congestion, runny nose, and facial discomfort for a week.   The history is provided by the patient.  URI  Presenting symptoms: congestion, cough, facial pain, fatigue, fever, rhinorrhea and sore throat   Severity:  Moderate Duration:  1 week Timing:  Constant Progression:  Worsening Chronicity:  New Relieved by:  Nothing Worsened by:  Nothing   Past Medical History:  Diagnosis Date  . Medical history non-contributory    Past Surgical History:  Procedure Laterality Date  . NO PAST SURGERIES     History reviewed. No pertinent family history. Social History  Substance Use Topics  . Smoking status: Never Smoker  . Smokeless tobacco: Never Used  . Alcohol use No   OB History    Gravida Para Term Preterm AB Living   3 3 3     3    SAB TAB Ectopic Multiple Live Births         0 3     Review of Systems  Constitutional: Positive for fatigue and fever.  HENT: Positive for congestion, rhinorrhea and sore throat.   Eyes: Negative.   Respiratory: Positive for cough.   Cardiovascular: Negative.   Gastrointestinal: Negative.   Endocrine: Negative.   Genitourinary: Negative.   Musculoskeletal: Negative.   Allergic/Immunologic: Negative.   Neurological: Negative.   Hematological: Negative.   Psychiatric/Behavioral: Negative.     Allergies  Patient has no known allergies.  Home Medications   Prior to Admission medications   Medication Sig Start Date End Date Taking? Authorizing Provider  amoxicillin (AMOXIL) 500 MG capsule Take 1 capsule (500 mg total) by mouth 3 (three) times daily. 09/19/16   Elson Areas, PA-C  azithromycin (ZITHROMAX) 250 MG tablet Take 1 tablet (250 mg total) by mouth daily. Take first 2 tablets together, then 1  every day until finished. 02/19/17   Jamae Tison, Anselm Pancoast, FNP  ibuprofen (ADVIL,MOTRIN) 600 MG tablet Take 1 tablet (600 mg total) by mouth every 6 (six) hours. 07/21/15   Marlow Baars, MD  ipratropium (ATROVENT) 0.06 % nasal spray Place 2 sprays into both nostrils 4 (four) times daily. 02/19/17   Deatra Canter, FNP  methylPREDNISolone (MEDROL DOSEPAK) 4 MG TBPK tablet Take 6-5-4-3-2-1 po qd 02/19/17   Deatra Canter, FNP  Prenatal Vit-Fe Fumarate-FA (PRENATAL MULTIVITAMIN) TABS tablet Take 1 tablet by mouth daily at 12 noon.    [provider]   Meds Ordered and Administered this Visit  Medications - No data to display  BP 122/78 (BP Location: Right Arm)   Pulse 82   Temp 98.6 F (37 C) (Oral)   Resp 18   LMP 02/19/2017   SpO2 100%  No data found.   Physical Exam  Constitutional: She is oriented to person, place, and time. She appears well-developed and well-nourished.  HENT:  Head: Normocephalic and atraumatic.  Eyes: Conjunctivae and EOM are normal. Pupils are equal, round, and reactive to light.  Neck: Normal range of motion. Neck supple.  Cardiovascular: Normal rate, regular rhythm and normal heart sounds.   Pulmonary/Chest: Effort normal and breath sounds normal.  Abdominal: Soft. Bowel sounds are normal.  Musculoskeletal: Normal range of motion.  Neurological: She is alert and oriented to person, place, and time.  Nursing note and vitals reviewed.   Urgent  Care Course     Procedures (including critical care time)  Labs Review Labs Reviewed - No data to display  Imaging Review No results found.   Visual Acuity Review  Right Eye Distance:   Left Eye Distance:   Bilateral Distance:    Right Eye Near:   Left Eye Near:    Bilateral Near:         MDM   1. Subacute maxillary sinusitis    Zpak Atrovent nasal spray Medrol dose pack as directed  Push po fluids, rest, tylenol and motrin otc prn as directed for fever, arthralgias, and  myalgias.  Follow up prn if sx's continue or persist.    Deatra CanterOxford, Moustapha Tooker J, FNP 02/19/17 [phone]

## 2017-03-01 ENCOUNTER — Ambulatory Visit (HOSPITAL_COMMUNITY)
Admission: EM | Admit: 2017-03-01 | Discharge: 2017-03-01 | Disposition: A | Discharge status: Home / Self Care | Payer: Medicaid Other | Attending: Emergency Medicine | Admitting: Emergency Medicine

## 2017-03-01 ENCOUNTER — Encounter (HOSPITAL_COMMUNITY): Payer: Self-pay | Admitting: Family Medicine

## 2017-03-01 DIAGNOSIS — H6692 Otitis media, unspecified, left ear: Secondary | ICD-10-CM | POA: Diagnosis not present

## 2017-03-01 DIAGNOSIS — J0100 Acute maxillary sinusitis, unspecified: Secondary | ICD-10-CM

## 2017-03-01 MED ORDER — AMOXICILLIN-POT CLAVULANATE 875-125 MG PO TABS: 1.0000 | ORAL_TABLET | Freq: Two times a day (BID) | ORAL | 0 refills | Status: DC

## 2017-03-01 NOTE — ED Provider Notes (Signed)
CSN: [id number]     Arrival date & time 03/01/17  1045 History   None    Chief Complaint  Patient presents with  . Allergies  . Fatigue   (Consider location/radiation/quality/duration/timing/severity/associated sxs/prior Treatment) 36 yr old female returns to UC with cc of facial pain, congestion, no improvemetn of sinusitis after completing zpak, was seen in this office 5/22 for same.    The history is provided by the patient. No language interpreter was used.  URI  Presenting symptoms: congestion, ear pain, facial pain and fatigue   Severity:  Moderate Onset quality:  Gradual Timing:  Constant Progression:  Unchanged Chronicity:  New Relieved by:  Nothing Worsened by:  Certain positions Ineffective treatments: z pak. Associated symptoms: sinus pain   Risk factors: recent illness     Past Medical History:  Diagnosis Date  . Medical history non-contributory    Past Surgical History:  Procedure Laterality Date  . NO PAST SURGERIES     History reviewed. No pertinent family history. Social History  Substance Use Topics  . Smoking status: Never Smoker  . Smokeless tobacco: Never Used  . Alcohol use No   OB History    Gravida Para Term Preterm AB Living   3 3 3     3    SAB TAB Ectopic Multiple Live Births         0 3     Review of Systems  Constitutional: Positive for fatigue.  HENT: Positive for congestion, ear pain and sinus pain.   Eyes: Negative.   Respiratory: Negative.   Cardiovascular: Negative.   Gastrointestinal: Negative.   Endocrine: Negative.   Genitourinary: Negative.   Musculoskeletal: Negative.   Skin: Negative.   Allergic/Immunologic: Positive for environmental allergies.  Neurological: Negative.   Hematological: Negative.   Psychiatric/Behavioral: Negative.   All other systems reviewed and are negative.   Allergies  Patient has no known allergies.  Home Medications   Prior to Admission medications   Medication Sig Start Date End Date  Taking? Authorizing Provider  amoxicillin-clavulanate (AUGMENTIN) 875-125 MG tablet Take 1 tablet by mouth every 12 (twelve) hours. 03/01/17   Mekiah Cambridge, Para March, NP  ibuprofen (ADVIL,MOTRIN) 600 MG tablet Take 1 tablet (600 mg total) by mouth every 6 (six) hours. 07/21/15   Marlow Baars, MD  ipratropium (ATROVENT) 0.06 % nasal spray Place 2 sprays into both nostrils 4 (four) times daily. 02/19/17   Deatra Canter, FNP  methylPREDNISolone (MEDROL DOSEPAK) 4 MG TBPK tablet Take 6-5-4-3-2-1 po qd 02/19/17   Deatra Canter, FNP  Prenatal Vit-Fe Fumarate-FA (PRENATAL MULTIVITAMIN) TABS tablet Take 1 tablet by mouth daily at 12 noon.    [provider]   Meds Ordered and Administered this Visit  Medications - No data to display  BP 105/71   Pulse 78   Temp 98.3 F (36.8 C)   Resp 18   LMP 02/19/2017   SpO2 99%  No data found.   Physical Exam  Constitutional: She is oriented to person, place, and time. She appears well-developed and well-nourished. She is active and cooperative. No distress.  HENT:  Head: Normocephalic.  Right Ear: Tympanic membrane is retracted.  Left Ear: Tympanic membrane is erythematous and bulging.  Nose: Mucosal edema and sinus tenderness present. Right sinus exhibits maxillary sinus tenderness. Left sinus exhibits maxillary sinus tenderness.  Mouth/Throat: Oropharynx is clear and moist.  No mastoid tenderness bilaterally  Eyes: Conjunctivae, EOM and lids are normal. Pupils are equal, round, and reactive to light.  Neck: Normal range of motion. No tracheal deviation present.  Cardiovascular: Regular rhythm, normal heart sounds and normal pulses.   No murmur heard. Pulmonary/Chest: Effort normal and breath sounds normal.  Abdominal: Soft. Bowel sounds are normal. There is no tenderness.  Musculoskeletal: Normal range of motion.  Lymphadenopathy:    She has no cervical adenopathy.  Neurological: She is alert and oriented to person, place, and time. GCS  eye subscore is 4. GCS verbal subscore is 5. GCS motor subscore is 6.  Skin: Skin is warm and dry. No rash noted.  Psychiatric: She has a normal mood and affect. Her speech is normal and behavior is normal.  Nursing note and vitals reviewed.   Urgent Care Course     Procedures (including critical care time)  Labs Review Labs Reviewed - No data to display  Imaging Review No results found.      MDM   1. Acute maxillary sinusitis, recurrence not specified   2. Left acute otitis media    Take meds as directed. Rest,push fluids. Return to UC as needed. Pt verbalized understanding to this provider    Clancy Gourdefelice, Ailed Defibaugh, NP 03/01/17 1512

## 2017-03-01 NOTE — Discharge Instructions (Addendum)
Take meds as directed. Rest,push fluids. Return to UC as needed

## 2017-03-01 NOTE — ED Triage Notes (Signed)
Pt here for allergies, fatigue, loss of appetite. sts since January. sts she was recently here for the same.

## 2018-03-07 ENCOUNTER — Encounter (HOSPITAL_COMMUNITY): Payer: Self-pay | Admitting: Emergency Medicine

## 2018-03-07 ENCOUNTER — Ambulatory Visit (HOSPITAL_COMMUNITY)
Admission: EM | Admit: 2018-03-07 | Discharge: 2018-03-07 | Disposition: A | Discharge status: Home / Self Care | Payer: Medicaid Other | Attending: Family Medicine | Admitting: Family Medicine

## 2018-03-07 ENCOUNTER — Other Ambulatory Visit: Payer: Self-pay

## 2018-03-07 DIAGNOSIS — G43009 Migraine without aura, not intractable, without status migrainosus: Secondary | ICD-10-CM | POA: Diagnosis not present

## 2018-03-07 DIAGNOSIS — R519 Headache, unspecified: Secondary | ICD-10-CM

## 2018-03-07 DIAGNOSIS — R51 Headache: Secondary | ICD-10-CM

## 2018-03-07 MED ORDER — KETOROLAC TROMETHAMINE 60 MG/2ML IM SOLN
INTRAMUSCULAR | Status: AC
  Filled 2018-03-07: qty 2

## 2018-03-07 MED ORDER — ONDANSETRON 8 MG PO TBDP: 8.0000 mg | ORAL_TABLET | Freq: Three times a day (TID) | ORAL | 0 refills | Status: DC | PRN

## 2018-03-07 MED ORDER — METOCLOPRAMIDE HCL 5 MG/ML IJ SOLN
5.0000 mg | Freq: Once | INTRAMUSCULAR | Status: AC
  Administered 2018-03-07: 5 mg via INTRAMUSCULAR

## 2018-03-07 MED ORDER — KETOROLAC TROMETHAMINE 60 MG/2ML IM SOLN
60.0000 mg | Freq: Once | INTRAMUSCULAR | Status: AC
  Administered 2018-03-07: 60 mg via INTRAMUSCULAR

## 2018-03-07 MED ORDER — SUMATRIPTAN SUCCINATE 50 MG PO TABS: 50.0000 mg | ORAL_TABLET | ORAL | 0 refills | Status: DC | PRN

## 2018-03-07 MED ORDER — DEXAMETHASONE SODIUM PHOSPHATE 10 MG/ML IJ SOLN
10.0000 mg | Freq: Once | INTRAMUSCULAR | Status: AC
  Administered 2018-03-07: 10 mg via INTRAMUSCULAR

## 2018-03-07 MED ORDER — DEXAMETHASONE SODIUM PHOSPHATE 10 MG/ML IJ SOLN
INTRAMUSCULAR | Status: AC
  Filled 2018-03-07: qty 1

## 2018-03-07 MED ORDER — METOCLOPRAMIDE HCL 5 MG/ML IJ SOLN
INTRAMUSCULAR | Status: AC
  Filled 2018-03-07: qty 2

## 2018-03-07 NOTE — Discharge Instructions (Addendum)
Take the sumatriptan at the first sign of the headache Use zofran as needed nausea Follow up with your primary care doctor or community wellness clinic

## 2018-03-07 NOTE — ED Triage Notes (Addendum)
Pt reports developing a severe headache last night after work.  She tried coffee and advil but neither one worked.  Pt is weak, nauseous, vomiting and feeling hot.

## 2018-03-07 NOTE — ED Provider Notes (Signed)
[facility]    CSN: [id number] Arrival date & time: 03/07/18  1238     History   Chief Complaint Chief Complaint  Patient presents with  . Headache    HPI Erika Little is a 37 y.o. female.   HPI  History of migraines years Ago.  Has not had one for 6 years.  Yesterday developed a headache.  Is gotten progressively worse.  Today she is vomiting.  No photophobia.  No cough cold runny nose or sore throat.  No sinus pressure pain.  No fever chills.  No head injury.  This does feel like a migraine to her.  She did not have any medicine to take at home except for Advil.  The Advil did not work, and then she threw up.  She is here for evaluation and treatment.  No numbness or weakness in arms and legs, no focal neurologic symptoms.   Past Medical History:  Diagnosis Date  . Medical history non-contributory     Patient Active Problem List   Diagnosis Date Noted  . Active labor 07/19/2015  . Encounter for supervision of normal pregnancy in multigravida 07/19/2015    Past Surgical History:  Procedure Laterality Date  . NO PAST SURGERIES      OB History    Gravida  3   Para  3   Term  3   Preterm      AB      Living  3     SAB      TAB      Ectopic      Multiple  0   Live Births  3            Home Medications    Prior to Admission medications   Medication Sig Start Date End Date Taking? Authorizing Provider  ibuprofen (ADVIL,MOTRIN) 600 MG tablet Take 1 tablet (600 mg total) by mouth every 6 (six) hours. 07/21/15  Yes Marlow Baars, MD  ipratropium (ATROVENT) 0.06 % nasal spray Place 2 sprays into both nostrils 4 (four) times daily. 02/19/17   Deatra Canter, FNP  Prenatal Vit-Fe Fumarate-FA (PRENATAL MULTIVITAMIN) TABS tablet Take 1 tablet by mouth daily at 12 noon.    [provider]    Family History History reviewed. No pertinent family history.  Social History Social History   Tobacco Use  . Smoking status: Never Smoker    . Smokeless tobacco: Never Used  Substance Use Topics  . Alcohol use: No  . Drug use: No     Allergies   Patient has no known allergies.   Review of Systems Review of Systems  Constitutional: Negative for chills and fever.  HENT: Negative for ear pain and sore throat.   Eyes: Negative for pain and visual disturbance.  Respiratory: Negative for cough and shortness of breath.   Cardiovascular: Negative for chest pain and palpitations.  Gastrointestinal: Positive for nausea and vomiting. Negative for abdominal pain.  Genitourinary: Negative for dysuria and hematuria.  Musculoskeletal: Negative for arthralgias and back pain.  Skin: Negative for color change and rash.  Neurological: Positive for headaches. Negative for seizures and syncope.  All other systems reviewed and are negative.    Physical Exam Triage Vital Signs ED Triage Vitals  Enc Vitals Group     BP 03/07/18 1254 (!) 119/91     Pulse Rate 03/07/18 1254 82     Resp --      Temp 03/07/18 1254 97.7 F (36.5 C)  Temp Source 03/07/18 1254 Oral     SpO2 03/07/18 1254 98 %     Weight --      Height --      Head Circumference --      Peak Flow --      Pain Score 03/07/18 1250 9     Pain Loc --      Pain Edu? --      Excl. in GC? --    No data found.  Updated Vital Signs BP (!) 119/91 (BP Location: Right Arm)   Pulse 82   Temp 97.7 F (36.5 C) (Oral)   SpO2 98%   Visual Acuity Right Eye Distance:   Left Eye Distance:   Bilateral Distance:    Right Eye Near:   Left Eye Near:    Bilateral Near:     Physical Exam  Constitutional: She appears well-developed and well-nourished. She appears ill. She appears distressed.  Acutely uncomfortable.  HENT:  Head: Normocephalic and atraumatic.  Mouth/Throat: Oropharynx is clear and moist.  Eyes: Pupils are equal, round, and reactive to light. Conjunctivae and EOM are normal.  Neck: Normal range of motion. Neck supple.  Cardiovascular: Normal rate.   Pulmonary/Chest: Effort normal. No respiratory distress.  Abdominal: Soft. She exhibits no distension.  Musculoskeletal: Normal range of motion. She exhibits no edema.  Neurological: She is alert. She has normal strength. She displays normal reflexes. No cranial nerve deficit or sensory deficit. She displays a negative Romberg sign.  Skin: Skin is warm and dry.  Psychiatric: She has a normal mood and affect.     UC Treatments / Results  Labs (all labs ordered are listed, but only abnormal results are displayed) Labs Reviewed - No data to display  EKG None  Radiology No results found.  Procedures Procedures (including critical care time)  Medications Ordered in UC Medications  ketorolac (TORADOL) injection 60 mg (has no administration in time range)  metoCLOPramide (REGLAN) injection 5 mg (has no administration in time range)  dexamethasone (DECADRON) injection 10 mg (has no administration in time range)    Initial Impression / Assessment and Plan / UC Course  I have reviewed the triage vital signs and the nursing notes.  Pertinent labs & imaging results that were available during my care of the patient were reviewed by me and considered in my medical decision making (see chart for details).    Patient was checked every 15 to 20 minutes after receiving her injections.  After approximately an hour she finally started to feel improvement and felt well enough to go home.  Repeat examination did not show any focal neurologic findings.  Final Clinical Impressions(s) / UC Diagnoses   Final diagnoses:  Migraine without aura and without status migrainosus, not intractable   Discharge Instructions   None    ED Prescriptions    None     Controlled Substance Prescriptions [place] Controlled Substance Registry consulted? Not Applicable   Eustace MooreNelson, Nyheim Seufert Sue, MD 03/07/18 Jerene Bears1920

## 2018-07-01 ENCOUNTER — Encounter (HOSPITAL_COMMUNITY): Payer: Self-pay

## 2018-07-01 ENCOUNTER — Ambulatory Visit (HOSPITAL_COMMUNITY)
Admission: EM | Admit: 2018-07-01 | Discharge: 2018-07-01 | Disposition: A | Discharge status: Home / Self Care | Payer: Medicaid Other | Attending: Family Medicine | Admitting: Family Medicine

## 2018-07-01 DIAGNOSIS — J019 Acute sinusitis, unspecified: Secondary | ICD-10-CM | POA: Diagnosis not present

## 2018-07-01 MED ORDER — PREDNISONE 20 MG PO TABS: 40.0000 mg | ORAL_TABLET | Freq: Every day | ORAL | 0 refills | Status: AC

## 2018-07-01 MED ORDER — AMOXICILLIN-POT CLAVULANATE 875-125 MG PO TABS: 1.0000 | ORAL_TABLET | Freq: Two times a day (BID) | ORAL | 0 refills | Status: AC

## 2018-07-01 NOTE — Discharge Instructions (Signed)
I am going to treat you for a sinus infection Please begin Augmentin twice daily for the next 10 days Please take prednisone 40 mg daily for the next 4 days, please have food on your stomach when taking this  For congestion you may take daily allergy pill like Zyrtec/Claritin as well as use Flonase nasal spray  For cough you may use Robitussin, Delsym, honey and hot tea  May supplement with Tylenol and ibuprofen as needed for muscle aches/headache  Please follow-up if symptoms worsening, developing worsening headache, vision changes, weakness, chest pain, shortness of breath, difficulty breathing, fevers, persistent symptoms

## 2018-07-01 NOTE — ED Triage Notes (Signed)
Pt presents with sinus pressure x a few weeks, on and off.

## 2018-07-01 NOTE — ED Provider Notes (Signed)
[facility]    CSN: [id number] Arrival date & time: 07/01/18  0909     History   Chief Complaint Chief Complaint  Patient presents with  . URI    HPI Erika Little is a 37 y.o. female no significant past medical history presenting today for evaluation of sinus pressure.  Patient states that for the past 2 to 3 weeks she has had pressure and her orbital area as well as nasal area.  She has had associated congestion, cough and sore throat.  She has tried Sudafed without relief.  States that that her symptoms would improve, but returned.  States that this feels different from her typical migraines.  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  She denies any fevers.  Denies chest pain or shortness of breath.  Denies ear pain.  HPI  Past Medical History:  Diagnosis Date  . Medical history non-contributory     Patient Active Problem List   Diagnosis Date Noted  . Active labor 07/19/2015  . Encounter for supervision of normal pregnancy in multigravida 07/19/2015    Past Surgical History:  Procedure Laterality Date  . NO PAST SURGERIES      OB History    Gravida  3   Para  3   Term  3   Preterm      AB      Living  3     SAB      TAB      Ectopic      Multiple  0   Live Births  3            Home Medications    Prior to Admission medications   Medication Sig Start Date End Date Taking? Authorizing Provider  amoxicillin-clavulanate (AUGMENTIN) 875-125 MG tablet Take 1 tablet by mouth 2 (two) times daily for 10 days. 07/01/18 07/11/18  Wieters, Hallie C, PA-C  ibuprofen (ADVIL,MOTRIN) 600 MG tablet Take 1 tablet (600 mg total) by mouth every 6 (six) hours. 07/21/15   Marlow Baars, MD  ipratropium (ATROVENT) 0.06 % nasal spray Place 2 sprays into both nostrils 4 (four) times daily. 02/19/17   Deatra Canter, FNP  ondansetron (ZOFRAN ODT) 8 MG disintegrating tablet Take 1 tablet (8 mg total) by mouth every 8  (eight) hours as needed for nausea or vomiting. 03/07/18   Eustace Fleer, MD  predniSONE (DELTASONE) 20 MG tablet Take 2 tablets (40 mg total) by mouth daily for 4 days. With food 07/01/18 07/05/18  Wieters, Hallie C, PA-C  Prenatal Vit-Fe Fumarate-FA (PRENATAL MULTIVITAMIN) TABS tablet Take 1 tablet by mouth daily at 12 noon.    [provider]  SUMAtriptan (IMITREX) 50 MG tablet Take 1 tablet (50 mg total) by mouth every 2 (two) hours as needed for migraine. May repeat in 2 hours if headache persists or recurs. 03/07/18   Eustace Parrilla, MD    Family History No family history on file.  Social History Social History   Tobacco Use  . Smoking status: Never Smoker  . Smokeless tobacco: Never Used  Substance Use Topics  . Alcohol use: No  . Drug use: No     Allergies   Patient has no known allergies.   Review of Systems Review of Systems  Constitutional: Positive for chills. Negative for activity change, appetite change, fatigue and fever.  HENT: Positive for congestion, rhinorrhea, sinus pressure and sore throat. Negative for ear pain and  trouble swallowing.   Eyes: Negative for photophobia, pain, discharge, redness and visual disturbance.  Respiratory: Positive for cough. Negative for chest tightness and shortness of breath.   Cardiovascular: Negative for chest pain.  Gastrointestinal: Negative for abdominal pain, diarrhea, nausea and vomiting.  Genitourinary: Negative for decreased urine volume and hematuria.  Musculoskeletal: Positive for myalgias. Negative for neck pain and neck stiffness.  Skin: Negative for rash.  Neurological: Positive for headaches. Negative for dizziness, syncope, facial asymmetry, speech difficulty, weakness, light-headedness and numbness.     Physical Exam Triage Vital Signs ED Triage Vitals  Enc Vitals Group     BP 07/01/18 0952 120/83     Pulse Rate 07/01/18 0952 91     Resp 07/01/18 0952 18     Temp 07/01/18 0952 97.9 F (36.6  C)     Temp Source 07/01/18 0952 Oral     SpO2 07/01/18 0952 99 %     Weight --      Height --      Head Circumference --      Peak Flow --      Pain Score 07/01/18 1005 8     Pain Loc --      Pain Edu? --      Excl. in GC? --    No data found.  Updated Vital Signs BP 120/83 (BP Location: Right Arm)   Pulse 91   Temp 97.9 F (36.6 C) (Oral)   Resp 18   SpO2 99%   Visual Acuity Right Eye Distance:   Left Eye Distance:   Bilateral Distance:    Right Eye Near:   Left Eye Near:    Bilateral Near:     Physical Exam  Constitutional: She appears well-developed and well-nourished. No distress.  HENT:  Head: Normocephalic and atraumatic.  Bilateral ears without tenderness to palpation of external auricle, tragus and mastoid, EAC's without erythema or swelling, TM's with good bony landmarks and cone of light. Non erythematous.  Maxillary sinus tenderness  Oral mucosa pink and moist, no tonsillar enlargement or exudate. Posterior pharynx patent and nonerythematous, no uvula deviation or swelling. Normal phonation.   Eyes: Conjunctivae are normal.  Neck: Neck supple.  Cardiovascular: Normal rate and regular rhythm.  No murmur heard. Pulmonary/Chest: Effort normal and breath sounds normal. No respiratory distress.  Breathing comfortably at rest, CTABL, no wheezing, rales or other adventitious sounds auscultated  Abdominal: Soft. There is no tenderness.  Musculoskeletal: She exhibits no edema.  Neurological: She is alert.  Skin: Skin is warm and dry.  Psychiatric: She has a normal mood and affect.  Nursing note and vitals reviewed.    UC Treatments / Results  Labs (all labs ordered are listed, but only abnormal results are displayed) Labs Reviewed - No data to display  EKG None  Radiology No results found.  Procedures Procedures (including critical care time)  Medications Ordered in UC Medications - No data to display  Initial Impression / Assessment and  Plan / UC Course  I have reviewed the triage vital signs and the nursing notes.  Pertinent labs & imaging results that were available during my care of the patient were reviewed by me and considered in my medical decision making (see chart for details).     Patient with sinus pressure and sinus symptoms off and on for 2 to 3 weeks, will treat as sinusitis with Augmentin, prednisone for 4 days, also discussed further symptomatic management of congestion, drainage and sore throat.Discussed strict return  precautions. Patient verbalized understanding and is agreeable with plan.  Final Clinical Impressions(s) / UC Diagnoses   Final diagnoses:  Acute sinusitis with symptoms > 10 days     Discharge Instructions     I am going to treat you for a sinus infection Please begin Augmentin twice daily for the next 10 days Please take prednisone 40 mg daily for the next 4 days, please have food on your stomach when taking this  For congestion you may take daily allergy pill like Zyrtec/Claritin as well as use Flonase nasal spray  For cough you may use Robitussin, Delsym, honey and hot tea  May supplement with Tylenol and ibuprofen as needed for muscle aches/headache  Please follow-up if symptoms worsening, developing worsening headache, vision changes, weakness, chest pain, shortness of breath, difficulty breathing, fevers, persistent symptoms    ED Prescriptions    Medication Sig Dispense Auth. Provider   amoxicillin-clavulanate (AUGMENTIN) 875-125 MG tablet Take 1 tablet by mouth 2 (two) times daily for 10 days. 20 tablet Wieters, Hallie C, PA-C   predniSONE (DELTASONE) 20 MG tablet Take 2 tablets (40 mg total) by mouth daily for 4 days. With food 8 tablet Wieters, Vado C, PA-C     Controlled Substance Prescriptions [place] Controlled Substance Registry consulted? Not Applicable   Lew Dawes, [place] 07/01/18 1227

## 2020-07-13 ENCOUNTER — Encounter (HOSPITAL_COMMUNITY): Payer: Self-pay | Admitting: Emergency Medicine

## 2020-07-13 ENCOUNTER — Other Ambulatory Visit: Payer: Self-pay

## 2020-07-13 ENCOUNTER — Ambulatory Visit (HOSPITAL_COMMUNITY)
Admission: EM | Admit: 2020-07-13 | Discharge: 2020-07-13 | Disposition: A | Discharge status: Home / Self Care | Payer: Medicaid Other | Attending: Internal Medicine | Admitting: Internal Medicine

## 2020-07-13 DIAGNOSIS — B9789 Other viral agents as the cause of diseases classified elsewhere: Secondary | ICD-10-CM | POA: Insufficient documentation

## 2020-07-13 DIAGNOSIS — Z20822 Contact with and (suspected) exposure to covid-19: Secondary | ICD-10-CM | POA: Diagnosis not present

## 2020-07-13 DIAGNOSIS — J029 Acute pharyngitis, unspecified: Secondary | ICD-10-CM

## 2020-07-13 DIAGNOSIS — R059 Cough, unspecified: Secondary | ICD-10-CM | POA: Insufficient documentation

## 2020-07-13 DIAGNOSIS — J028 Acute pharyngitis due to other specified organisms: Secondary | ICD-10-CM | POA: Diagnosis not present

## 2020-07-13 LAB — RESP PANEL BY RT PCR (RSV, FLU A&B, COVID)
Influenza A by PCR: NEGATIVE
Influenza B by PCR: NEGATIVE
Respiratory Syncytial Virus by PCR: NEGATIVE
SARS Coronavirus 2 by RT PCR: NEGATIVE

## 2020-07-13 MED ORDER — GUAIFENESIN ER 600 MG PO TB12: 600.0000 mg | ORAL_TABLET | Freq: Two times a day (BID) | ORAL | 0 refills | Status: AC

## 2020-07-13 MED ORDER — IBUPROFEN 600 MG PO TABS: 600.0000 mg | ORAL_TABLET | Freq: Four times a day (QID) | ORAL | 0 refills | Status: DC | PRN

## 2020-07-13 MED ORDER — FLUTICASONE PROPIONATE 50 MCG/ACT NA SUSP: 1.0000 | Freq: Every day | NASAL | 0 refills | Status: AC

## 2020-07-13 NOTE — ED Provider Notes (Signed)
[facility]    CSN: [id number] Arrival date & time: 07/13/20  1511      History   Chief Complaint Chief Complaint  Patient presents with  . Cough  . Sore Throat  . Fatigue    HPI Erika Little is a 39 y.o. female comes to urgent care with complaints of sore throat, cough, headaches and generalized fatigue.  Patient symptoms started 2 days ago and has been persistent.  She has tried over-the-counter treatments with no improvement in symptoms.  She denies any nausea or vomiting.  She endorses generalized body aches.  No diarrhea.  No sick contacts.  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  She is not vaccinated against Covid 19 virus.  No shortness of breath or wheezing.Marland Kitchen   HPI  Past Medical History:  Diagnosis Date  . Medical history non-contributory     Patient Active Problem List   Diagnosis Date Noted  . Active labor 07/19/2015  . Encounter for supervision of normal pregnancy in multigravida 07/19/2015    Past Surgical History:  Procedure Laterality Date  . NO PAST SURGERIES      OB History    Gravida  3   Para  3   Term  3   Preterm      AB      Living  3     SAB      TAB      Ectopic      Multiple  0   Live Births  3            Home Medications    Prior to Admission medications   Medication Sig Start Date End Date Taking? Authorizing Provider  fluticasone (FLONASE) 50 MCG/ACT nasal spray Place 1 spray into both nostrils daily. 07/13/20   Jenavive Lamboy, Britta Mccreedy, MD  guaiFENesin (MUCINEX) 600 MG 12 hr tablet Take 1 tablet (600 mg total) by mouth 2 (two) times daily for 10 days. 07/13/20 07/23/20  Merrilee Jansky, MD  ibuprofen (ADVIL) 600 MG tablet Take 1 tablet (600 mg total) by mouth every 6 (six) hours as needed. 07/13/20   Tayvia Faughnan, Britta Mccreedy, MD  ipratropium (ATROVENT) 0.06 % nasal spray Place 2 sprays into both nostrils 4 (four) times daily. 02/19/17 07/13/20  Deatra Canter, FNP  SUMAtriptan (IMITREX) 50 MG tablet Take 1 tablet (50 mg  total) by mouth every 2 (two) hours as needed for migraine. May repeat in 2 hours if headache persists or recurs. 03/07/18 07/13/20  Eustace Welchel, MD    Family History History reviewed. No pertinent family history.  Social History Social History   Tobacco Use  . Smoking status: Never Smoker  . Smokeless tobacco: Never Used  Substance Use Topics  . Alcohol use: No  . Drug use: No     Allergies   Patient has no known allergies.   Review of Systems Review of Systems  Constitutional: Positive for fatigue.  HENT: Positive for congestion and sore throat. Negative for postnasal drip.   Respiratory: Negative for cough and shortness of breath.   Gastrointestinal: Negative.   Genitourinary: Negative.      Physical Exam Triage Vital Signs ED Triage Vitals  Enc Vitals Group     BP 07/13/20 1608 (!) 131/95     Pulse Rate 07/13/20 1608 81     Resp 07/13/20 1608 15     Temp 07/13/20 1608 98.5 F (36.9 C)     Temp Source 07/13/20 1608 Oral  SpO2 07/13/20 1608 100 %     Weight --      Height --      Head Circumference --      Peak Flow --      Pain Score 07/13/20 1603 6     Pain Loc --      Pain Edu? --      Excl. in GC? --    No data found.  Updated Vital Signs BP (!) 131/95 (BP Location: Left Arm)   Pulse 81   Temp 98.5 F (36.9 C) (Oral)   Resp 15   SpO2 100%   Visual Acuity Right Eye Distance:   Left Eye Distance:   Bilateral Distance:    Right Eye Near:   Left Eye Near:    Bilateral Near:     Physical Exam Vitals and nursing note reviewed.  Constitutional:      General: She is not in acute distress.    Appearance: She is not ill-appearing.  HENT:     Right Ear: A middle ear effusion is present. Tympanic membrane is not erythematous.     Left Ear: A middle ear effusion is present. Tympanic membrane is not erythematous.     Nose: No congestion or rhinorrhea.     Mouth/Throat:     Mouth: Mucous membranes are moist. Mucous membranes are pale.      Pharynx: No oropharyngeal exudate or posterior oropharyngeal erythema.     Tonsils: 0 on the right. 0 on the left.  Cardiovascular:     Rate and Rhythm: Normal rate and regular rhythm.  Pulmonary:     Effort: Pulmonary effort is normal.     Breath sounds: Normal breath sounds.  Skin:    Capillary Refill: Capillary refill takes less than 2 seconds.  Neurological:     Mental Status: She is alert.      UC Treatments / Results  Labs (all labs ordered are listed, but only abnormal results are displayed) Labs Reviewed  RESP PANEL BY RT PCR (RSV, FLU A&B, COVID)    EKG   Radiology No results found.  Procedures Procedures (including critical care time)  Medications Ordered in UC Medications - No data to display  Initial Impression / Assessment and Plan / UC Course  I have reviewed the triage vital signs and the nursing notes.  Pertinent labs & imaging results that were available during my care of the patient were reviewed by me and considered in my medical decision making (see chart for details).    1.  Acute viral pharyngitis Respiratory panel PCR Patient is advised to quarantine until COVID-19 test is available Ibuprofen as needed for body aches Mucinex Return precautions given Fluticasone nasal spray. Final Clinical Impressions(s) / UC Diagnoses   Final diagnoses:  Acute viral pharyngitis     Discharge Instructions     Increase fluid intake Quarantine until covid-19 test is available.   ED Prescriptions    Medication Sig Dispense Auth. Provider   ibuprofen (ADVIL) 600 MG tablet Take 1 tablet (600 mg total) by mouth every 6 (six) hours as needed. 30 tablet Dusti Tetro, Britta Mccreedy, MD   guaiFENesin (MUCINEX) 600 MG 12 hr tablet Take 1 tablet (600 mg total) by mouth 2 (two) times daily for 10 days. 20 tablet Emmanuel Ercole, Britta Mccreedy, MD   fluticasone (FLONASE) 50 MCG/ACT nasal spray Place 1 spray into both nostrils daily. 16 g Joseeduardo Brix, Britta Mccreedy, MD     PDMP not  reviewed this encounter.  Merrilee Jansky, MD 07/13/20 1729

## 2020-07-13 NOTE — ED Triage Notes (Signed)
Patient presents to urgent care today with symptoms of itchy throat, cough and fatigue. Symptoms began on monday. They have tried otc throat spray and cough drops with some relief of symptoms. Pt states she began having headaches and body aches today.

## 2020-07-13 NOTE — Discharge Instructions (Signed)
Increase fluid intake Quarantine until covid-19 test is available.

## 2020-10-02 ENCOUNTER — Encounter (HOSPITAL_COMMUNITY): Payer: Self-pay | Admitting: Emergency Medicine

## 2020-10-02 ENCOUNTER — Other Ambulatory Visit: Payer: Self-pay

## 2020-10-02 ENCOUNTER — Emergency Department (HOSPITAL_COMMUNITY)
Admission: EM | Admit: 2020-10-02 | Discharge: 2020-10-02 | Disposition: A | Discharge status: Home / Self Care | Payer: Medicaid Other | Attending: Emergency Medicine | Admitting: Emergency Medicine

## 2020-10-02 DIAGNOSIS — U071 COVID-19: Secondary | ICD-10-CM | POA: Diagnosis not present

## 2020-10-02 DIAGNOSIS — R059 Cough, unspecified: Secondary | ICD-10-CM | POA: Diagnosis present

## 2020-10-02 LAB — RESP PANEL BY RT-PCR (FLU A&B, COVID) ARPGX2
Influenza A by PCR: NEGATIVE
Influenza B by PCR: NEGATIVE
SARS Coronavirus 2 by RT PCR: POSITIVE — AB

## 2020-10-02 MED ORDER — ACETAMINOPHEN 325 MG PO TABS
650.0000 mg | ORAL_TABLET | Freq: Once | ORAL | Status: AC | PRN
  Administered 2020-10-02: 650 mg via ORAL
  Filled 2020-10-02: qty 2

## 2020-10-02 MED ORDER — IBUPROFEN 400 MG PO TABS
400.0000 mg | ORAL_TABLET | Freq: Once | ORAL | Status: AC
  Administered 2020-10-02: 400 mg via ORAL
  Filled 2020-10-02: qty 1

## 2020-10-02 NOTE — ED Triage Notes (Signed)
Pt c/o fever, cough and sore throat.

## 2020-10-02 NOTE — ED Provider Notes (Signed)
[facility] EMERGENCY DEPARTMENT Provider Note   CSN: [id number] Arrival date & time: 10/02/20  1509     History Chief Complaint  Patient presents with  . Fever    Erika Little is a 40 y.o. female.  Patient c/o fever, non prod cough, and body aches in the past 1- 2 days. Symptoms acute onset, moderate, constant, persistent. Denies sore throat. No headache. No neck pain or stiffness. No chest pain or sob. No abd pain or nvd. No specific known ill contacts. Did have covid vaccine x 2.   The history is provided by the patient.  Fever Associated symptoms: cough and myalgias   Associated symptoms: no chest pain, no chills, no confusion, no headaches, no rash, no sore throat and no vomiting        Past Medical History:  Diagnosis Date  . Medical history non-contributory     Patient Active Problem List   Diagnosis Date Noted  . Active labor 07/19/2015  . Encounter for supervision of normal pregnancy in multigravida 07/19/2015    Past Surgical History:  Procedure Laterality Date  . NO PAST SURGERIES       OB History    Gravida  3   Para  3   Term  3   Preterm      AB      Living  3     SAB      IAB      Ectopic      Multiple  0   Live Births  3           No family history on file.  Social History   Tobacco Use  . Smoking status: Never Smoker  . Smokeless tobacco: Never Used  Substance Use Topics  . Alcohol use: No  . Drug use: No    Home Medications Prior to Admission medications   Medication Sig Start Date End Date Taking? Authorizing Provider  fluticasone (FLONASE) 50 MCG/ACT nasal spray Place 1 spray into both nostrils daily. 07/13/20   Lamptey, Britta Mccreedy, MD  ibuprofen (ADVIL) 600 MG tablet Take 1 tablet (600 mg total) by mouth every 6 (six) hours as needed. 07/13/20   Lamptey, Britta Mccreedy, MD  ipratropium (ATROVENT) 0.06 % nasal spray Place 2 sprays into both nostrils 4 (four) times daily. 02/19/17 07/13/20  Deatra Canter, FNP  SUMAtriptan (IMITREX) 50 MG tablet Take 1 tablet (50 mg total) by mouth every 2 (two) hours as needed for migraine. May repeat in 2 hours if headache persists or recurs. 03/07/18 07/13/20  Eustace Beckmann, MD    Allergies    Patient has no known allergies.  Review of Systems   Review of Systems  Constitutional: Positive for fever. Negative for chills.  HENT: Negative for sore throat.   Eyes: Negative for redness.  Respiratory: Positive for cough. Negative for shortness of breath.   Cardiovascular: Negative for chest pain.  Gastrointestinal: Negative for abdominal pain and vomiting.  Genitourinary: Negative for flank pain.  Musculoskeletal: Positive for myalgias. Negative for back pain and neck pain.  Skin: Negative for rash.  Neurological: Negative for headaches.  Hematological: Does not bruise/bleed easily.  Psychiatric/Behavioral: Negative for confusion.    Physical Exam Updated Vital Signs BP (!) 142/92 (BP Location: Left Arm)   Pulse (!) 105   Temp (!) 101.2 F (38.4 C) (Oral)   Resp 16   SpO2 100%   Physical Exam Vitals and nursing note reviewed.  Constitutional:  Appearance: Normal appearance. She is well-developed.  HENT:     Head: Atraumatic.     Nose: Nose normal.     Mouth/Throat:     Mouth: Mucous membranes are moist.     Pharynx: Oropharynx is clear. No oropharyngeal exudate or posterior oropharyngeal erythema.  Eyes:     General: No scleral icterus.    Conjunctiva/sclera: Conjunctivae normal.  Neck:     Trachea: No tracheal deviation.  Cardiovascular:     Rate and Rhythm: Normal rate and regular rhythm.     Pulses: Normal pulses.     Heart sounds: Normal heart sounds. No murmur heard. No friction rub. No gallop.   Pulmonary:     Effort: Pulmonary effort is normal. No respiratory distress.     Breath sounds: Normal breath sounds.  Abdominal:     General: Bowel sounds are normal. There is no distension.     Palpations: Abdomen is soft.      Tenderness: There is no abdominal tenderness.  Genitourinary:    Comments: No cva tenderness.  Musculoskeletal:        General: No swelling.     Cervical back: Normal range of motion and neck supple. No rigidity. No muscular tenderness.     Right lower leg: No edema.     Left lower leg: No edema.  Skin:    General: Skin is warm and dry.     Findings: No rash.  Neurological:     Mental Status: She is alert.     Comments: Alert, speech normal.   Psychiatric:        Mood and Affect: Mood normal.     ED Results / Procedures / Treatments   Labs (all labs ordered are listed, but only abnormal results are displayed) Results for orders placed or performed during the hospital encounter of 10/02/20  Resp Panel by RT-PCR (Flu A&B, Covid) Nasopharyngeal Swab   Specimen: Nasopharyngeal Swab; Nasopharyngeal(NP) swabs in vial transport medium  Result Value Ref Range   SARS Coronavirus 2 by RT PCR POSITIVE (A) NEGATIVE   Influenza A by PCR NEGATIVE NEGATIVE   Influenza B by PCR NEGATIVE NEGATIVE    EKG None  Radiology No results found.  Procedures Procedures (including critical care time)  Medications Ordered in ED Medications  ibuprofen (ADVIL) tablet 400 mg (has no administration in time range)  acetaminophen (TYLENOL) tablet 650 mg (650 mg Oral Given 10/02/20 1703)    ED Course  I have reviewed the triage vital signs and the nursing notes.  Pertinent labs & imaging results that were available during my care of the patient were reviewed by me and considered in my medical decision making (see chart for details).    MDM Rules/Calculators/A&P                         Labs sent.  Reviewed nursing notes and prior charts for additional history.   Acetaminophen po.  Labs reviewed/interpreted by me - covid test is positive.   Erika Little was evaluated in Emergency Department on 10/02/2020 for the symptoms described in the history of present illness. She was evaluated in the  context of the global COVID-19 pandemic, which necessitated consideration that the patient might be at risk for infection with the SARS-CoV-2 virus that causes COVID-19. Institutional protocols and algorithms that pertain to the evaluation of patients at risk for COVID-19 are in a state of rapid change based on information released by regulatory bodies  including the CDC and federal and state organizations. These policies and algorithms were followed during the patient's care in the ED.  Discussed results w pt.   Ibuprofen, po fluids.  Pt appears well hydrated, is breathing comfortably, and room air pulse ox 99-100%.  Pt currently appears stable for d/c.   Return precautions provided.    Final Clinical Impression(s) / ED Diagnoses Final diagnoses:  None    Rx / DC Orders ED Discharge Orders    None       Lajean Saver, MD 10/02/20 2011

## 2020-10-02 NOTE — Discharge Instructions (Addendum)
It was our pleasure to provide your ER care today - we hope that you feel better.  Stay well hydrated, get adequate nutrition. Stay active, take full and deep breaths.  Take acetaminophen and/or ibuprofen as need for  fever and body aches.   See covid information/instructions.  Return to ER if worse, new symptoms, increased trouble breathing, or other concern.

## 2020-10-02 NOTE — ED Notes (Signed)
Patient verbalized understanding of discharge instructions. Opportunity for questions and answers.  

## 2021-06-01 ENCOUNTER — Ambulatory Visit (HOSPITAL_COMMUNITY)
Admission: EM | Admit: 2021-06-01 | Discharge: 2021-06-01 | Disposition: A | Discharge status: Home / Self Care | Payer: Medicaid Other | Attending: Family Medicine | Admitting: Family Medicine

## 2021-06-01 ENCOUNTER — Other Ambulatory Visit: Payer: Self-pay

## 2021-06-01 ENCOUNTER — Encounter (HOSPITAL_COMMUNITY): Payer: Self-pay

## 2021-06-01 DIAGNOSIS — R5383 Other fatigue: Secondary | ICD-10-CM | POA: Diagnosis not present

## 2021-06-01 LAB — BASIC METABOLIC PANEL
Anion gap: 8 (ref 5–15)
BUN: 9 mg/dL (ref 6–20)
CO2: 26 mmol/L (ref 22–32)
Calcium: 9.5 mg/dL (ref 8.9–10.3)
Chloride: 105 mmol/L (ref 98–111)
Creatinine, Ser: 0.67 mg/dL (ref 0.44–1.00)
GFR, Estimated: >60 mL/min (ref >60)
Glucose, Bld: 56 mg/dL — ABNORMAL LOW (ref 70–99)
Potassium: 3.8 mmol/L (ref 3.5–5.1)
Sodium: 139 mmol/L (ref 135–145)

## 2021-06-01 LAB — CBC
HCT: 34.4 % — ABNORMAL LOW (ref 36.0–46.0)
Hemoglobin: 10.4 g/dL — ABNORMAL LOW (ref 12.0–15.0)
MCH: 23.4 pg — ABNORMAL LOW (ref 26.0–34.0)
MCHC: 30.2 g/dL (ref 30.0–36.0)
MCV: 77.3 fL — ABNORMAL LOW (ref 80.0–100.0)
Platelets: 333 10*3/uL (ref 150–400)
RBC: 4.45 MIL/uL (ref 3.87–5.11)
RDW: 15.6 % — ABNORMAL HIGH (ref 11.5–15.5)
WBC: 9 10*3/uL (ref 4.0–10.5)
nRBC: 0.4 % — ABNORMAL HIGH (ref 0.0–0.2)

## 2021-06-01 LAB — TSH: TSH: 1.833 u[IU]/mL (ref 0.350–4.500)

## 2021-06-01 NOTE — ED Triage Notes (Signed)
Pt presents with c/o itchy arms, fatigue and generalized body aches. Pt states she took an allergy medicine that relieved the itching. She states she feels itchy after showering and states she has not been stressed.

## 2021-06-01 NOTE — Discharge Instructions (Addendum)
You have had labs (blood work) drawn today. We will call you with any significant abnormalities or if there is need to begin or change treatment or pursue further follow up.  You may also review your test results online through MyChart. If you do not have a MyChart account, instructions to sign up should be on your discharge paperwork.  

## 2021-06-06 NOTE — ED Provider Notes (Signed)
  [facility]   [id number] 06/01/21 Arrival Time: 1057  ASSESSMENT & PLAN:  1. Fatigue, unspecified type    CBC, BMP, TSH pending. Current symptoms may be related to viral illness; discussed. COVID-19 testing declined. OTC symptom care as needed.    Follow-up Information     [facility]   Specialty: Urgent Care Why: If worsening or failing to improve as anticipated. Contact information: [address] [place] [address] [phone]                Reviewed expectations re: course of current medical issues. Questions answered. Outlined signs and symptoms indicating need for more acute intervention. Understanding verbalized. After Visit Summary given.   SUBJECTIVE: History from: patient. Erika Little is a 40 y.o. female who reports sev weeks of fatigue. Worse over past week; child with URI symptoms. She is afebrile. Denies: runny nose, congestion, fever, cough, sore throat, difficulty breathing, and headache. Normal PO intake without n/v/d.   OBJECTIVE:  Vitals:   06/01/21 1255  BP: (!) 149/87  Pulse: 77  Resp: 17  Temp: 98.7 F (37.1 C)  TempSrc: Oral  SpO2: 97%    General appearance: alert; no distress Eyes: PERRLA; EOMI; conjunctiva normal HENT: [place]; AT; without nasal congestion Neck: supple  Lungs: speaks full sentences without difficulty; unlabored; clear CV: reg Extremities: no edema Skin: warm and dry Neurologic: normal gait Psychological: alert and cooperative; normal mood and affect   No Known Allergies  Past Medical History:  Diagnosis Date   Medical history non-contributory    Social History   Socioeconomic History   Marital status: Legally Separated    Spouse name: Not on file   Number of children: Not on file   Years of education: Not on file   Highest education level: Not on file  Occupational History   Not on file  Tobacco Use   Smoking status: Never   Smokeless tobacco:  Never  Substance and Sexual Activity   Alcohol use: No   Drug use: No   Sexual activity: Yes  Other Topics Concern   Not on file  Social History Narrative   Not on file   Social Determinants of Health   Financial Resource Strain: Not on file  Food Insecurity: Not on file  Transportation Needs: Not on file  Physical Activity: Not on file  Stress: Not on file  Social Connections: Not on file  Intimate Partner Violence: Not on file   History reviewed. No pertinent family history. Past Surgical History:  Procedure Laterality Date   NO PAST SURGERIES       Mardella Layman, MD 06/06/21 [phone]

## 2022-01-22 ENCOUNTER — Ambulatory Visit (HOSPITAL_COMMUNITY)
Admission: EM | Admit: 2022-01-22 | Discharge: 2022-01-22 | Disposition: A | Discharge status: Home / Self Care | Payer: Medicaid Other | Attending: Internal Medicine | Admitting: Internal Medicine

## 2022-01-22 ENCOUNTER — Encounter (HOSPITAL_COMMUNITY): Payer: Self-pay | Admitting: Emergency Medicine

## 2022-01-22 DIAGNOSIS — R300 Dysuria: Secondary | ICD-10-CM

## 2022-01-22 DIAGNOSIS — Z113 Encounter for screening for infections with a predominantly sexual mode of transmission: Secondary | ICD-10-CM | POA: Diagnosis present

## 2022-01-22 DIAGNOSIS — M7918 Myalgia, other site: Secondary | ICD-10-CM | POA: Diagnosis not present

## 2022-01-22 LAB — POCT URINALYSIS DIPSTICK, ED / UC
Bilirubin Urine: NEGATIVE
Glucose, UA: NEGATIVE mg/dL
Hgb urine dipstick: NEGATIVE
Ketones, ur: NEGATIVE mg/dL
Nitrite: NEGATIVE
Protein, ur: NEGATIVE mg/dL
Specific Gravity, Urine: 1.02 (ref 1.005–1.030)
Urobilinogen, UA: 0.2 mg/dL (ref 0.0–1.0)
pH: 6 (ref 5.0–8.0)

## 2022-01-22 LAB — POC URINE PREG, ED: Preg Test, Ur: NEGATIVE

## 2022-01-22 MED ORDER — METHOCARBAMOL 500 MG PO TABS: 500.0000 mg | ORAL_TABLET | Freq: Two times a day (BID) | ORAL | 0 refills | Status: AC

## 2022-01-22 MED ORDER — NAPROXEN 500 MG PO TABS: 500.0000 mg | ORAL_TABLET | Freq: Two times a day (BID) | ORAL | 0 refills | Status: AC

## 2022-01-22 NOTE — ED Triage Notes (Signed)
Pt reports for a month having pain in neck and back. 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 Reports can feel sharp pain esp when take deep breath or cough. Reports yesterday after work felt like a big balloon stuck in back. 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 ?

## 2022-01-22 NOTE — Discharge Instructions (Addendum)
Your pain to your right side of your back is likely musculoskeletal in nature.  As discussed, please take naproxen twice daily with food.  It is very important that you take this medication with food to avoid GI upset.  This will help with inflammation and pain.  After 5 days, you may take this as needed for your pain. ? ?Please also take methocarbamol which is a muscle relaxer.  You may take 1/2 tablet at a time instead of a whole tablet at a time if you wish to do so.  This medication may make you sleepy so please do not drive or drink alcohol while taking this. ? ?At night, once you have had your Robaxin and your naproxen in your body, please apply heat to your back and perform gentle stretches.  I believe this will help with your pain. ? ?Your urinalysis did not show signs of infection today.  We sent your urine for a urine culture to make sure that there is no bacteria present.  You will be receiving a phone call regarding the results of this in the next 2 to 3 days if it is positive or if it changes the treatment plan. ? ?Your vaginal swab will also be resulting in the next 2 to 3 days for any STIs you might have.  You will receive a phone call from [place] if these results are positive.  If you do not receive a phone call from [place], this means that your results are negative.  Please continue to use condoms to prevent the spread of STIs and urinate immediately after vaginal intercourse to prevent urinary tract infections. ? ?I have signed you up for the PCP assistance program today.  I noticed that you do not have a primary care provider, so someone will be calling you from [facility] health to set up an appointment with a primary care provider to manage your care.  Please follow-up with your new PCP for ongoing evaluation and wellness visits. ? ?Please return here if you develop any new or worsening symptoms.  If symptoms are severe please go to the emergency department. ?

## 2022-01-22 NOTE — ED Provider Notes (Signed)
[facility]    CSN: [id number] Arrival date & time: 01/22/22  1004      History   Chief Complaint Chief Complaint  Patient presents with   Back Pain   Neck Pain    HPI Erika Little is a 41 y.o. female.   Patient presents to urgent care for nonradiating back pain that has been worsening for the last 3 days. Back pain is located to her right side/back. Worsened with movement of arms above her head and with lateral hip flexion and extension. See was seen 1.5 months ago for a "crick in her neck" that got better with massage. She had a massage 7 days ago and reported improvement of symptoms. Patient states it hurts to wear tight pants and hurts to take a full deep breath, cough, and sneeze. Works as a Advertising account planner. Took advil after working yesterday with temporary relief. Reports irregular periods for the last 3-4 months. Patient states she has an IUD for birth control. Patient states she has had dysuria for 3-4 days. Denies frequency, fever, urgency, and hematuria. Denies vaginal discharge and odor. Last bowel movement this morning.  Denies surgical history. Reports new sexual partner in the last week.  Concerned that dysuria is being caused by sexual intercourse.  Does not remember if she urinated after intercourse.  States she used a condom, but agrees to STI testing today. Denies any other aggravating or relieving factors.    Back Pain Neck Pain  Past Medical History:  Diagnosis Date   Medical history non-contributory     Patient Active Problem List   Diagnosis Date Noted   Active labor 07/19/2015   Encounter for supervision of normal pregnancy in multigravida 07/19/2015    Past Surgical History:  Procedure Laterality Date   NO PAST SURGERIES      OB History     Gravida  3   Para  3   Term  3   Preterm      AB      Living  3      SAB      IAB      Ectopic      Multiple  0   Live Births  3            Home Medications    Prior to  Admission medications   Medication Sig Start Date End Date Taking? Authorizing Provider  methocarbamol (ROBAXIN) 500 MG tablet Take 1 tablet (500 mg total) by mouth 2 (two) times daily. 01/22/22  Yes Carlisle Beers, FNP  naproxen (NAPROSYN) 500 MG tablet Take 1 tablet (500 mg total) by mouth 2 (two) times daily. 01/22/22  Yes Carlisle Beers, FNP  fluticasone (FLONASE) 50 MCG/ACT nasal spray Place 1 spray into both nostrils daily. 07/13/20   Lamptey, Britta Mccreedy, MD  ibuprofen (ADVIL) 600 MG tablet Take 1 tablet (600 mg total) by mouth every 6 (six) hours as needed. 07/13/20   Lamptey, Britta Mccreedy, MD  ipratropium (ATROVENT) 0.06 % nasal spray Place 2 sprays into both nostrils 4 (four) times daily. 02/19/17 07/13/20  Deatra Canter, FNP  SUMAtriptan (IMITREX) 50 MG tablet Take 1 tablet (50 mg total) by mouth every 2 (two) hours as needed for migraine. May repeat in 2 hours if headache persists or recurs. 03/07/18 07/13/20  Eustace Tapley, MD    Family History No family history on file.  Social History Social History   Tobacco Use   Smoking status: Never  Smokeless tobacco: Never  Substance Use Topics   Alcohol use: No   Drug use: No     Allergies   Patient has no known allergies.   Review of Systems Review of Systems  Musculoskeletal:  Positive for back pain and neck pain.  Per HPI  Physical Exam Triage Vital Signs ED Triage Vitals  Enc Vitals Group     BP 01/22/22 1119 (!) 128/92     Pulse Rate 01/22/22 1119 76     Resp 01/22/22 1119 18     Temp 01/22/22 1119 99.3 F (37.4 C)     Temp Source 01/22/22 1119 Oral     SpO2 01/22/22 1119 97 %     Weight --      Height --      Head Circumference --      Peak Flow --      Pain Score 01/22/22 1117 9     Pain Loc --      Pain Edu? --      Excl. in GC? --    No data found.  Updated Vital Signs BP (!) 128/92 (BP Location: Right Arm)   Pulse 76   Temp 99.3 F (37.4 C) (Oral)   Resp 18   SpO2 97%   Visual  Acuity Right Eye Distance:   Left Eye Distance:   Bilateral Distance:    Right Eye Near:   Left Eye Near:    Bilateral Near:     Physical Exam Vitals and nursing note reviewed.  Constitutional:      General: She is not in acute distress.    Appearance: Normal appearance. She is well-developed. She is not ill-appearing.  HENT:     Head: Normocephalic and atraumatic.     Right Ear: Tympanic membrane, ear canal and external ear normal.     Left Ear: Tympanic membrane, ear canal and external ear normal.     Nose: Nose normal.     Mouth/Throat:     Mouth: Mucous membranes are moist.  Eyes:     Extraocular Movements: Extraocular movements intact.     Conjunctiva/sclera: Conjunctivae normal.  Cardiovascular:     Rate and Rhythm: Normal rate and regular rhythm.     Heart sounds: Normal heart sounds. No murmur heard.   No friction rub. No gallop.  Pulmonary:     Effort: Pulmonary effort is normal. No respiratory distress.     Breath sounds: Normal breath sounds. No wheezing, rhonchi or rales.  Chest:     Chest wall: No tenderness.  Abdominal:     Palpations: Abdomen is soft.     Tenderness: There is no abdominal tenderness. There is no right CVA tenderness or left CVA tenderness.  Musculoskeletal:        General: Tenderness present. No swelling or signs of injury.       Arms:     Cervical back: Neck supple.     Comments: Decreased range of motion with flexion at the hips laterally due to right lateral back pain.  Salonpas patch currently located on area of most pain.  No CVA tenderness bilaterally.   Skin:    General: Skin is warm and dry.     Capillary Refill: Capillary refill takes less than 2 seconds.     Findings: No rash.  Neurological:     General: No focal deficit present.     Mental Status: She is alert and oriented to person, place, and time.  Psychiatric:  Mood and Affect: Mood normal.        Behavior: Behavior normal.        Thought Content: Thought content  normal.        Judgment: Judgment normal.     UC Treatments / Results  Labs (all labs ordered are listed, but only abnormal results are displayed) Labs Reviewed  POCT URINALYSIS DIPSTICK, ED / UC - Abnormal; Notable for the following components:      Result Value   Leukocytes,Ua TRACE (*)    All other components within normal limits  URINE CULTURE  POC URINE PREG, ED  CERVICOVAGINAL ANCILLARY ONLY    EKG   Radiology No results found.  Procedures Procedures (including critical care time)  Medications Ordered in UC Medications - No data to display  Initial Impression / Assessment and Plan / UC Course  I have reviewed the triage vital signs and the nursing notes.  Pertinent labs & imaging results that were available during my care of the patient were reviewed by me and considered in my medical decision making (see chart for details).  Patient presents urgent care with a 3-day history of worsening lateral right thoracic back pain.  Doubt relation to urinary symptoms.  Negative CVA tenderness bilaterally.  Pain is likely musculoskeletal in nature and due to patient's positioning at her job.  Plan to treat with Robaxin muscle relaxer at night.  Patient instructed to not take this medication and drink alcohol or drive due to potential side effect of drowsiness.  Also plan to treat with naproxen 500 mg twice daily for 5 days. Counseled patient on appropriate use and adverse effects of medications prescribed today.  Discussed nonpharmacological treatments of pain including heat and gentle stretches to prevent muscle stiffness.  Patient to return to urgent care or follow-up with her primary care provider if her pain does not improve with these interventions.  STI testing completed today.  Patient will receive these results in 2 to 3 days if they are positive via a phone call from our callback nurse.  Instructed that she will not receive a phone call if these results are negative or if they  do not change our treatment plan.  Defer treatment until results come back.  Urinalysis did not show obvious urinary tract infection, but did show trace leukocytes.  Urine culture pending.  No need for antibiotics at this time unless urine culture results are positive.  We will treat at that time when results come back if needed.  Patient verbalizes understanding and agreement with this treatment plan.  All questions answered.  Instructed to follow-up with her primary care provider for further evaluation and management of her symptoms.  PCP assistance initiated today.   Final Clinical Impressions(s) / UC Diagnoses   Final diagnoses:  Routine screening for STI (sexually transmitted infection)  Dysuria  Musculoskeletal pain     Discharge Instructions      Your pain to your right side of your back is likely musculoskeletal in nature.  As discussed, please take naproxen twice daily with food.  It is very important that you take this medication with food to avoid GI upset.  This will help with inflammation and pain.  After 5 days, you may take this as needed for your pain.  Please also take methocarbamol which is a muscle relaxer.  You may take 1/2 tablet at a time instead of a whole tablet at a time if you wish to do so.  This medication may make  you sleepy so please do not drive or drink alcohol while taking this.  At night, once you have had your Robaxin and your naproxen in your body, please apply heat to your back and perform gentle stretches.  I believe this will help with your pain.  Your urinalysis did not show signs of infection today.  We sent your urine for a urine culture to make sure that there is no bacteria present.  You will be receiving a phone call regarding the results of this in the next 2 to 3 days if it is positive or if it changes the treatment plan.  Your vaginal swab will also be resulting in the next 2 to 3 days for any STIs you might have.  You will receive a phone  call from [place] if these results are positive.  If you do not receive a phone call from [place], this means that your results are negative.  Please continue to use condoms to prevent the spread of STIs and urinate immediately after vaginal intercourse to prevent urinary tract infections.  I have signed you up for the PCP assistance program today.  I noticed that you do not have a primary care provider, so someone will be calling you from [facility] health to set up an appointment with a primary care provider to manage your care.  Please follow-up with your new PCP for ongoing evaluation and wellness visits.  Please return here if you develop any new or worsening symptoms.  If symptoms are severe please go to the emergency department.     ED Prescriptions     Medication Sig Dispense Auth. Provider   methocarbamol (ROBAXIN) 500 MG tablet Take 1 tablet (500 mg total) by mouth 2 (two) times daily. 20 tablet Reita May M, FNP   naproxen (NAPROSYN) 500 MG tablet Take 1 tablet (500 mg total) by mouth 2 (two) times daily. 30 tablet Carlisle Beers, FNP      PDMP not reviewed this encounter.   Reita May [place], [place] 01/23/22 [id number]

## 2022-01-23 LAB — CERVICOVAGINAL ANCILLARY ONLY
Bacterial Vaginitis (gardnerella): POSITIVE — AB
Candida Glabrata: NEGATIVE
Candida Vaginitis: NEGATIVE
Chlamydia: POSITIVE — AB
Comment: NEGATIVE
Comment: NEGATIVE
Comment: NEGATIVE
Comment: NEGATIVE
Comment: NEGATIVE
Comment: NORMAL
Neisseria Gonorrhea: NEGATIVE
Trichomonas: NEGATIVE

## 2022-01-23 LAB — URINE CULTURE: Culture: <10000 — AB

## 2022-01-24 ENCOUNTER — Telehealth (HOSPITAL_COMMUNITY): Payer: Self-pay | Admitting: Emergency Medicine

## 2022-01-24 ENCOUNTER — Encounter (HOSPITAL_COMMUNITY): Payer: Self-pay

## 2022-01-24 MED ORDER — METRONIDAZOLE 500 MG PO TABS: 500.0000 mg | ORAL_TABLET | Freq: Two times a day (BID) | ORAL | 0 refills | Status: DC

## 2022-01-24 MED ORDER — DOXYCYCLINE HYCLATE 100 MG PO CAPS: 100.0000 mg | ORAL_CAPSULE | Freq: Two times a day (BID) | ORAL | 0 refills | Status: AC

## 2022-02-05 ENCOUNTER — Ambulatory Visit (HOSPITAL_COMMUNITY)
Admission: EM | Admit: 2022-02-05 | Discharge: 2022-02-05 | Disposition: A | Discharge status: Home / Self Care | Payer: Medicaid Other | Attending: Internal Medicine | Admitting: Internal Medicine

## 2022-02-05 ENCOUNTER — Encounter (HOSPITAL_COMMUNITY): Payer: Self-pay | Admitting: Emergency Medicine

## 2022-02-05 DIAGNOSIS — K2100 Gastro-esophageal reflux disease with esophagitis, without bleeding: Secondary | ICD-10-CM

## 2022-02-05 MED ORDER — PANTOPRAZOLE SODIUM 20 MG PO TBEC: 20.0000 mg | DELAYED_RELEASE_TABLET | Freq: Two times a day (BID) | ORAL | 0 refills | Status: AC

## 2022-02-05 MED ORDER — ALUM & MAG HYDROXIDE-SIMETH 200-200-20 MG/5ML PO SUSP
ORAL | Status: AC
  Filled 2022-02-05: qty 30

## 2022-02-05 MED ORDER — LIDOCAINE VISCOUS HCL 2 % MT SOLN
OROMUCOSAL | Status: AC
  Filled 2022-02-05: qty 15

## 2022-02-05 MED ORDER — LIDOCAINE VISCOUS HCL 2 % MT SOLN
15.0000 mL | Freq: Once | OROMUCOSAL | Status: AC
  Administered 2022-02-05: 15 mL via ORAL

## 2022-02-05 MED ORDER — ALUM & MAG HYDROXIDE-SIMETH 200-200-20 MG/5ML PO SUSP
30.0000 mL | Freq: Once | ORAL | Status: AC
  Administered 2022-02-05: 30 mL via ORAL

## 2022-02-05 NOTE — ED Triage Notes (Signed)
Pt reports being fatigued for a month.  ?Reports for about a week having epigastric pains and burning. Took heart burn medications 3 times and helped only little. Denies vomiting.  ?Reports that felt like taking zinc and tea got stuck in epigastric area.  ? ?

## 2022-02-05 NOTE — Discharge Instructions (Addendum)
You were seen today for acid reflux.  Please take Protonix 20 mg 30 minutes before breakfast and 30 minutes before dinner for 1 month. Do not take any more naproxen as I believe this has aggravated your stomach lining. You may take tylenol for any pain, fever, or inflammation you may experience.  ? ?Please eat 5 small meals per day to avoid excessive acid production by your stomach.  Please also elevate the head of your bed when you sleep and avoid laying down until 2 hours have passed after eating dinner.  Avoid foods that are triggers to your reflux such as spicy foods, fried foods, citrus-based foods and citrus fruit.  ? ?If you develop any new or worsening symptoms or do not improve in the next 2 to 3 days, please return.  If your symptoms are severe, please go to the emergency room.  Follow-up with your primary care provider for further evaluation and management of your symptoms as well as ongoing wellness visits.  I hope you feel better!  ?

## 2022-02-05 NOTE — ED Provider Notes (Signed)
?[facility] ? ? ? ?CSN: [id number] ?Arrival date & time: 02/05/22  [id number] ? ? ?  ? ?History   ?Chief Complaint ?Chief Complaint  ?Patient presents with  ? Fatigue  ? Heartburn  ? ? ?HPI ?Erika Little is a 41 y.o. female.  ? ?Patient presents to urgent care for evaluation of right and left upper quadrant abdominal pain as well as bloating and esophageal burning for the last week.  She has also been very fatigued for the last week as she has not been able to eat a lot of food due to her abdominal pain and bloating.  She was recently seen in urgent care on April 24 and given naproxen to take twice daily for 5 days for muscle strain to her back.  Her muscle strain to her back is improved.  She was also recently on Flagyl for bacterial vaginosis.  Her vaginal symptoms have also improved.  Her esophageal burning is aggravated by intake of spicy foods.  She does also drink coffee and reports intake of citrus foods.  Denies any changes to her diet recently. She notices her abdominal pain is worse after she eats and states it "feels like something is stuck" in her middle abdomen after she eats as well as slight nausea. Also reports increased belching and reflux after eating.  She states that she cannot lay flat after eating as this makes her abdomen very uncomfortable and her esophageal burning worse.  She denies chest pain, shortness of breath, lower abdomen pain, diarrhea, vomiting, fever, urinary symptoms, vaginal discharge, and dizziness.  Last bowel movement was this morning and normal.  She is not a diabetic.  She did have 1 episode of feeling lightheaded on the way to work 2 days ago that lasted 15 minutes and got better when she ate food.  She has tried taking Tums for her symptoms which helped "a little bit".  Denies any other aggravating or relieving factors at this time. ? ? ?Heartburn ? ? ?Past Medical History:  ?Diagnosis Date  ? Medical history non-contributory   ? ? ?Patient Active Problem List  ?  Diagnosis Date Noted  ? Active labor 07/19/2015  ? Encounter for supervision of normal pregnancy in multigravida 07/19/2015  ? ? ?Past Surgical History:  ?Procedure Laterality Date  ? NO PAST SURGERIES    ? ? ?OB History   ? ? Gravida  ?3  ? Para  ?3  ? Term  ?3  ? Preterm  ?   ? AB  ?   ? Living  ?3  ?  ? ? SAB  ?   ? IAB  ?   ? Ectopic  ?   ? Multiple  ?0  ? Live Births  ?3  ?   ?  ?  ? ? ? ?Home Medications   ? ?Prior to Admission medications   ?Medication Sig Start Date End Date Taking? Authorizing Provider  ?pantoprazole (PROTONIX) 20 MG tablet Take 1 tablet (20 mg total) by mouth 2 (two) times daily. 02/05/22  Yes Carlisle BeersStanhope, Brevin Mcfadden M, FNP  ?fluticasone (FLONASE) 50 MCG/ACT nasal spray Place 1 spray into both nostrils daily. 07/13/20   LampteyBritta Mccreedy, Philip O, MD  ?ibuprofen (ADVIL) 600 MG tablet Take 1 tablet (600 mg total) by mouth every 6 (six) hours as needed. 07/13/20   Lamptey, Britta MccreedyPhilip O, MD  ?methocarbamol (ROBAXIN) 500 MG tablet Take 1 tablet (500 mg total) by mouth 2 (two) times daily. 01/22/22   Carlisle BeersStanhope, Adynn Caseres M,  FNP  ?metroNIDAZOLE (FLAGYL) 500 MG tablet Take 1 tablet (500 mg total) by mouth 2 (two) times daily. 01/24/22   Lamptey, Britta Mccreedy, MD  ?naproxen (NAPROSYN) 500 MG tablet Take 1 tablet (500 mg total) by mouth 2 (two) times daily. 01/22/22   Carlisle Beers, FNP  ?ipratropium (ATROVENT) 0.06 % nasal spray Place 2 sprays into both nostrils 4 (four) times daily. 02/19/17 07/13/20  Deatra Canter, FNP  ?SUMAtriptan (IMITREX) 50 MG tablet Take 1 tablet (50 mg total) by mouth every 2 (two) hours as needed for migraine. May repeat in 2 hours if headache persists or recurs. 03/07/18 07/13/20  Eustace Tamez, MD  ? ? ?Family History ?No family history on file. ? ?Social History ?Social History  ? ?Tobacco Use  ? Smoking status: Never  ? Smokeless tobacco: Never  ?Substance Use Topics  ? Alcohol use: No  ? Drug use: No  ? ? ? ?Allergies   ?Patient has no known allergies. ? ? ?Review of  Systems ?Review of Systems  ?Gastrointestinal:  Positive for heartburn.  ?Per HPI ? ?Physical Exam ?Triage Vital Signs ?ED Triage Vitals  ?Enc Vitals Group  ?   BP 02/05/22 1014 (!) 139/95  ?   Pulse Rate 02/05/22 1014 91  ?   Resp 02/05/22 1014 17  ?   Temp 02/05/22 1014 98.4 ?F (36.9 ?C)  ?   Temp Source 02/05/22 1014 Oral  ?   SpO2 02/05/22 1014 98 %  ?   Weight --   ?   Height --   ?   Head Circumference --   ?   Peak Flow --   ?   Pain Score 02/05/22 1012 8  ?   Pain Loc --   ?   Pain Edu? --   ?   Excl. in GC? --   ? ?No data found. ? ?Updated Vital Signs ?BP (!) 139/95 (BP Location: Right Arm)   Pulse 91   Temp 98.4 ?F (36.9 ?C) (Oral)   Resp 17   SpO2 98%  ? ?Visual Acuity ?Right Eye Distance:   ?Left Eye Distance:   ?Bilateral Distance:   ? ?Right Eye Near:   ?Left Eye Near:    ?Bilateral Near:    ? ?Physical Exam ?Vitals and nursing note reviewed.  ?Constitutional:   ?   General: She is not in acute distress. ?   Appearance: Normal appearance. She is well-developed. She is not ill-appearing.  ?HENT:  ?   Head: Normocephalic and atraumatic.  ?   Right Ear: Tympanic membrane, ear canal and external ear normal.  ?   Left Ear: Tympanic membrane, ear canal and external ear normal.  ?   Nose: Nose normal.  ?   Mouth/Throat:  ?   Mouth: Mucous membranes are moist.  ?Eyes:  ?   Extraocular Movements: Extraocular movements intact.  ?   Conjunctiva/sclera: Conjunctivae normal.  ?Cardiovascular:  ?   Rate and Rhythm: Normal rate and regular rhythm.  ?   Heart sounds: Normal heart sounds. No murmur heard. ?  No friction rub. No gallop.  ?Pulmonary:  ?   Effort: Pulmonary effort is normal. No respiratory distress.  ?   Breath sounds: Normal breath sounds. No wheezing, rhonchi or rales.  ?Chest:  ?   Chest wall: No tenderness.  ?Abdominal:  ?   General: Abdomen is flat. Bowel sounds are normal.  ?   Palpations: Abdomen is soft.  ?   Tenderness:  There is abdominal tenderness in the right upper quadrant, epigastric  area, periumbilical area and left upper quadrant. There is no right CVA tenderness, left CVA tenderness or guarding. Negative signs include Murphy's sign and McBurney's sign.  ?Musculoskeletal:     ?   General: No swelling.  ?   Cervical back: Neck supple.  ?Skin: ?   General: Skin is warm and dry.  ?   Capillary Refill: Capillary refill takes less than 2 seconds.  ?   Findings: No rash.  ?Neurological:  ?   General: No focal deficit present.  ?   Mental Status: She is alert and oriented to person, place, and time.  ?Psychiatric:     ?   Mood and Affect: Mood normal.     ?   Behavior: Behavior normal.     ?   Thought Content: Thought content normal.     ?   Judgment: Judgment normal.  ? ? ? ?UC Treatments / Results  ?Labs ?(all labs ordered are listed, but only abnormal results are displayed) ?Labs Reviewed - No data to display ? ?EKG ? ? ?Radiology ?No results found. ? ?Procedures ?Procedures (including critical care time) ? ?Medications Ordered in UC ?Medications  ?alum & mag hydroxide-simeth (MAALOX/MYLANTA) 200-200-20 MG/5ML suspension 30 mL (30 mLs Oral Given 02/05/22 1153)  ?  And  ?lidocaine (XYLOCAINE) 2 % viscous mouth solution 15 mL (15 mLs Oral Given 02/05/22 1153)  ? ? ?Initial Impression / Assessment and Plan / UC Course  ?I have reviewed the triage vital signs and the nursing notes. ? ?Pertinent labs & imaging results that were available during my care of the patient were reviewed by me and considered in my medical decision making (see chart for details). ? ?Patient is a 41 year old female presenting today for esophageal burning, bilateral upper quadrant abdominal pain, and fatigue for the last week.  Patient states that she felt fine prior to last week.  She was recently treated at urgent care with naproxen twice daily for muscle spasm to her back.  Suspect that her symptoms are gastroesophageal reflux related to recent NSAID administration given her subjective history and physical exam findings today..   She states that she took the naproxen with food diligently as instructed by provider and has never had GERD symptoms after taking an NSAID in the past.  Suspect patient's episode of lightheadedness was likely due to hypoglyce

## 2022-03-15 ENCOUNTER — Ambulatory Visit (HOSPITAL_COMMUNITY)
Admission: EM | Admit: 2022-03-15 | Discharge: 2022-03-15 | Disposition: A | Discharge status: Home / Self Care | Payer: Medicaid Other | Attending: Internal Medicine | Admitting: Internal Medicine

## 2022-03-15 ENCOUNTER — Encounter (HOSPITAL_COMMUNITY): Payer: Self-pay

## 2022-03-15 DIAGNOSIS — H5711 Ocular pain, right eye: Secondary | ICD-10-CM

## 2022-03-15 DIAGNOSIS — S0501XA Injury of conjunctiva and corneal abrasion without foreign body, right eye, initial encounter: Secondary | ICD-10-CM

## 2022-03-15 MED ORDER — TETRACAINE HCL 0.5 % OP SOLN
OPHTHALMIC | Status: AC
  Filled 2022-03-15: qty 4

## 2022-03-15 MED ORDER — FLUORESCEIN SODIUM 1 MG OP STRP
ORAL_STRIP | OPHTHALMIC | Status: AC
  Filled 2022-03-15: qty 1

## 2022-03-15 MED ORDER — GENTAMICIN SULFATE 0.3 % OP SOLN: 2.0000 [drp] | OPHTHALMIC | 0 refills | Status: AC

## 2022-03-15 MED ORDER — IBUPROFEN 600 MG PO TABS: 600.0000 mg | ORAL_TABLET | Freq: Four times a day (QID) | ORAL | 0 refills | Status: AC | PRN

## 2022-03-15 NOTE — ED Triage Notes (Signed)
Pt states has something in rt eye since last night. States feels it under upper eye lid. States used eye flushes with no relief.

## 2022-03-15 NOTE — ED Provider Notes (Signed)
[facility]    CSN: [id number] Arrival date & time: 03/15/22  1414      History   Chief Complaint Chief Complaint  Patient presents with   Eye Pain    HPI Erika Little is a 41 y.o. female.   Patient presents to urgent care for evaluation of possible foreign body to her right eye and right eye pain since last night.  She has tried to use over-the-counter eye flushes multiple times at home to get the foreign body out of her eye with no relief.  She is currently experiencing pain to the underside of her upper eyelid and states that "it feels like there is something in her eye that is moving whenever she rolls her eye and moves her eye".  She denies blurry vision, decreased visual acuity, headache, dizziness, and floaters in her vision.  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  She does not wear eye protection while doing this and has never had this problem in the past.  Eyes not itchy at this time, but she reports mild pain.   Eye Pain    Past Medical History:  Diagnosis Date   Medical history non-contributory     Patient Active Problem List   Diagnosis Date Noted   Active labor 07/19/2015   Encounter for supervision of normal pregnancy in multigravida 07/19/2015    Past Surgical History:  Procedure Laterality Date   NO PAST SURGERIES      OB History     Gravida  3   Para  3   Term  3   Preterm      AB      Living  3      SAB      IAB      Ectopic      Multiple  0   Live Births  3            Home Medications    Prior to Admission medications   Medication Sig Start Date End Date Taking? Authorizing Provider  gentamicin (GARAMYCIN) 0.3 % ophthalmic solution Place 2 drops into the right eye every 4 (four) hours. 03/15/22  Yes Carlisle Beers, FNP  fluticasone (FLONASE) 50 MCG/ACT nasal spray Place 1 spray into both nostrils daily. 07/13/20   Lamptey,  Britta Mccreedy, MD  ibuprofen (ADVIL) 600 MG tablet Take 1 tablet (600 mg total) by mouth every 6 (six) hours as needed. 03/15/22   Carlisle Beers, FNP  methocarbamol (ROBAXIN) 500 MG tablet Take 1 tablet (500 mg total) by mouth 2 (two) times daily. 01/22/22   Carlisle Beers, FNP  naproxen (NAPROSYN) 500 MG tablet Take 1 tablet (500 mg total) by mouth 2 (two) times daily. 01/22/22   Carlisle Beers, FNP  pantoprazole (PROTONIX) 20 MG tablet Take 1 tablet (20 mg total) by mouth 2 (two) times daily. 02/05/22   Carlisle Beers, FNP  ipratropium (ATROVENT) 0.06 % nasal spray Place 2 sprays into both nostrils 4 (four) times daily. 02/19/17 07/13/20  Deatra Canter, FNP  SUMAtriptan (IMITREX) 50 MG tablet Take 1 tablet (50 mg total) by mouth every 2 (two) hours as needed for migraine. May repeat in 2 hours if headache persists or recurs. 03/07/18 07/13/20  Eustace Ladley, MD    Family History No family history on file.  Social History Social History   Tobacco Use  Smoking status: Never   Smokeless tobacco: Never  Substance Use Topics   Alcohol use: No   Drug use: No     Allergies   Patient has no known allergies.   Review of Systems Review of Systems  Eyes:  Positive for pain.  Per HPI   Physical Exam Triage Vital Signs ED Triage Vitals  Enc Vitals Group     BP 03/15/22 1542 122/79     Pulse Rate 03/15/22 1542 87     Resp 03/15/22 1542 18     Temp 03/15/22 1542 98.4 F (36.9 C)     Temp Source 03/15/22 1542 Oral     SpO2 03/15/22 1542 97 %     Weight --      Height --      Head Circumference --      Peak Flow --      Pain Score 03/15/22 1543 6     Pain Loc --      Pain Edu? --      Excl. in GC? --    No data found.  Updated Vital Signs BP 122/79 (BP Location: Left Arm)   Pulse 87   Temp 98.4 F (36.9 C) (Oral)   Resp 18   SpO2 97%   Breastfeeding No   Visual Acuity Right Eye Distance: 20/100 Left Eye Distance: 20/100 Bilateral  Distance: 20/100  Right Eye Near:   Left Eye Near:    Bilateral Near:     Physical Exam Vitals and nursing note reviewed.  Constitutional:      General: She is not in acute distress.    Appearance: Normal appearance. She is well-developed. She is not ill-appearing.  HENT:     Head: Normocephalic and atraumatic.     Right Ear: Hearing, tympanic membrane, ear canal and external ear normal.     Left Ear: Hearing, tympanic membrane, ear canal and external ear normal.     Nose: Nose normal.     Mouth/Throat:     Lips: Pink.     Mouth: Mucous membranes are moist.  Eyes:     General: Lids are everted, no foreign bodies appreciated. Vision grossly intact. Gaze aligned appropriately. No allergic shiner or visual field deficit.       Right eye: No foreign body, discharge or hordeolum.     Extraocular Movements: Extraocular movements intact.     Conjunctiva/sclera: Conjunctivae normal.     Right eye: Right conjunctiva is not injected.     Left eye: Left conjunctiva is not injected.     Pupils: Pupils are equal, round, and reactive to light.     Right eye: Fluorescein uptake present.     Comments: No exudate or corneal injection to bilateral eyes.  No foreign body to right eye after careful and thorough examination.  Right eyelid appears mildly swollen compared to left.  No redness, warmth, or drainage to bilateral eyes.  3 small areas of fluorescein uptake to inferior pupil of right cornea along the lower lid likely due to scratching after attempting to remove foreign body herself last night.  No fluorescein uptake to the upper eye.   Cardiovascular:     Rate and Rhythm: Normal rate and regular rhythm.     Heart sounds: Normal heart sounds.  Pulmonary:     Effort: Pulmonary effort is normal.     Breath sounds: Normal breath sounds.  Abdominal:     Palpations: Abdomen is soft.  Musculoskeletal:  General: No swelling.     Cervical back: Full passive range of motion without pain,  normal range of motion and neck supple.  Skin:    General: Skin is warm and dry.     Capillary Refill: Capillary refill takes less than 2 seconds.     Findings: No rash.  Neurological:     General: No focal deficit present.     Mental Status: She is alert and oriented to person, place, and time. Mental status is at baseline.  Psychiatric:        Mood and Affect: Mood normal.        Behavior: Behavior normal.        Thought Content: Thought content normal.        Judgment: Judgment normal.      UC Treatments / Results  Labs (all labs ordered are listed, but only abnormal results are displayed) Labs Reviewed - No data to display  EKG   Radiology No results found.  Procedures Procedures (including critical care time)  Medications Ordered in UC Medications - No data to display  Initial Impression / Assessment and Plan / UC Course  I have reviewed the triage vital signs and the nursing notes.  Pertinent labs & imaging results that were available during my care of the patient were reviewed by me and considered in my medical decision making (see chart for details).   Abrasion of right cornea Fluorescein staining reveals 3 small corneal abrasions to the right eye inferior to the pupil likely related to rubbing the eye in attempt to remove possible foreign body. No foreign body visualized after thorough exam. Gentamycin eye drops to right eye 2 drops every 4 hours prescribed. Ibuprofen 600mg  every 6 hours as needed for pain and inflammation. 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 She may apply warm compresses to eye prior to placing antibiotic eye drops to further provide comfort and pain relief.   Counseled patient regarding appropriate use of medications and potential side effects for all medications recommended or prescribed today. Discussed red flag signs and symptoms of worsening  condition,when to call the PCP office, return to urgent care, and when to seek higher level of care. Patient verbalizes understanding and agreement with plan. All questions answered. Patient discharged in stable condition.    Final Clinical Impressions(s) / UC Diagnoses   Final diagnoses:  Abrasion of right cornea, initial encounter  Pain of right eye     Discharge Instructions      Place 2 drops of gentamicin eyedrops into the right eye every 4 hours for the next 7 days to treat the scratch to your right eye.  No foreign body noted to your right eye today.  Follow-up with the eye doctor for reevaluation as needed.  Wear protective eyewear while at work to avoid dust and other particles from nails getting into your eyes.  If you develop any new or worsening symptoms or do not improve in the next 2 to 3 days, please return.  If your symptoms are severe, please go to the emergency room.  Follow-up with your primary care provider for further evaluation and management of your symptoms as well as ongoing wellness visits.  I hope you feel better!     ED Prescriptions     Medication Sig Dispense Auth. Provider   gentamicin (GARAMYCIN) 0.3 % ophthalmic solution Place 2 drops into the right eye every 4 (  four) hours. 5 mL Reita May M, FNP   ibuprofen (ADVIL) 600 MG tablet Take 1 tablet (600 mg total) by mouth every 6 (six) hours as needed. 30 tablet Carlisle Beers, FNP      PDMP not reviewed this encounter.   Carlisle Beers, [place] 03/18/22 1031

## 2022-03-15 NOTE — Discharge Instructions (Signed)
Place 2 drops of gentamicin eyedrops into the right eye every 4 hours for the next 7 days to treat the scratch to your right eye.  No foreign body noted to your right eye today.  Follow-up with the eye doctor for reevaluation as needed.  Wear protective eyewear while at work to avoid dust and other particles from nails getting into your eyes.  If you develop any new or worsening symptoms or do not improve in the next 2 to 3 days, please return.  If your symptoms are severe, please go to the emergency room.  Follow-up with your primary care provider for further evaluation and management of your symptoms as well as ongoing wellness visits.  I hope you feel better!
